# Patient Record
Sex: Male | Born: 1945
Health system: Southern US, Community
[De-identification: ages and names within clinical notes are randomized; demographics above are authoritative.]

## PROBLEM LIST (undated history)

## (undated) DIAGNOSIS — M545 Other chronic pain: Secondary | ICD-10-CM

## (undated) DIAGNOSIS — Z9889 Other specified postprocedural states: Secondary | ICD-10-CM

## (undated) DIAGNOSIS — R7303 Prediabetes: Secondary | ICD-10-CM

## (undated) DIAGNOSIS — R351 Nocturia: Secondary | ICD-10-CM

## (undated) DIAGNOSIS — G8929 Other chronic pain: Secondary | ICD-10-CM

## (undated) DIAGNOSIS — R350 Frequency of micturition: Secondary | ICD-10-CM

## (undated) DIAGNOSIS — Z8719 Personal history of other diseases of the digestive system: Secondary | ICD-10-CM

## (undated) DIAGNOSIS — R3912 Poor urinary stream: Secondary | ICD-10-CM

## (undated) DIAGNOSIS — M1990 Unspecified osteoarthritis, unspecified site: Secondary | ICD-10-CM

## (undated) DIAGNOSIS — R972 Elevated prostate specific antigen [PSA]: Secondary | ICD-10-CM

## (undated) HISTORY — PX: ESOPHAGEAL DILATION: SHX303

## (undated) HISTORY — PX: STOMACH SURGERY: SHX791

## (undated) HISTORY — PX: OTHER SURGICAL HISTORY: SHX169

---

## 1997-08-09 HISTORY — PX: OTHER SURGICAL HISTORY: SHX169

## 1998-03-23 ENCOUNTER — Emergency Department (HOSPITAL_COMMUNITY): Admission: EM | Admit: 1998-03-23 | Discharge: 1998-03-23 | Payer: Self-pay | Admitting: Emergency Medicine

## 2000-04-08 ENCOUNTER — Encounter: Payer: Self-pay | Admitting: Emergency Medicine

## 2000-04-08 ENCOUNTER — Emergency Department (HOSPITAL_COMMUNITY): Admission: EM | Admit: 2000-04-08 | Discharge: 2000-04-08 | Payer: Self-pay | Admitting: Emergency Medicine

## 2000-04-12 ENCOUNTER — Encounter: Admission: RE | Admit: 2000-04-12 | Discharge: 2000-04-21 | Payer: Self-pay | Admitting: Orthopedic Surgery

## 2000-06-23 ENCOUNTER — Encounter: Payer: Self-pay | Admitting: Emergency Medicine

## 2000-06-23 ENCOUNTER — Encounter (INDEPENDENT_AMBULATORY_CARE_PROVIDER_SITE_OTHER): Payer: Self-pay | Admitting: Specialist

## 2000-06-23 ENCOUNTER — Inpatient Hospital Stay (HOSPITAL_COMMUNITY): Admission: EM | Admit: 2000-06-23 | Discharge: 2000-07-05 | Payer: Self-pay | Admitting: Emergency Medicine

## 2000-06-24 ENCOUNTER — Encounter: Payer: Self-pay | Admitting: Surgery

## 2000-06-25 ENCOUNTER — Encounter: Payer: Self-pay | Admitting: Surgery

## 2000-06-26 ENCOUNTER — Encounter: Payer: Self-pay | Admitting: Thoracic Surgery

## 2000-06-27 ENCOUNTER — Encounter: Payer: Self-pay | Admitting: Thoracic Surgery

## 2000-06-28 ENCOUNTER — Encounter: Payer: Self-pay | Admitting: Thoracic Surgery

## 2000-06-29 ENCOUNTER — Encounter: Payer: Self-pay | Admitting: Thoracic Surgery

## 2000-07-01 ENCOUNTER — Encounter: Payer: Self-pay | Admitting: Surgery

## 2000-07-02 ENCOUNTER — Encounter: Payer: Self-pay | Admitting: Surgery

## 2000-07-03 ENCOUNTER — Encounter: Payer: Self-pay | Admitting: Thoracic Surgery

## 2000-07-04 ENCOUNTER — Encounter: Payer: Self-pay | Admitting: Thoracic Surgery

## 2000-07-04 ENCOUNTER — Encounter: Payer: Self-pay | Admitting: Surgery

## 2000-07-05 ENCOUNTER — Encounter: Payer: Self-pay | Admitting: Surgery

## 2000-07-12 ENCOUNTER — Encounter: Admission: RE | Admit: 2000-07-12 | Discharge: 2000-07-12 | Payer: Self-pay | Admitting: Surgery

## 2000-07-12 ENCOUNTER — Encounter: Payer: Self-pay | Admitting: Surgery

## 2000-07-27 ENCOUNTER — Ambulatory Visit (HOSPITAL_COMMUNITY): Admission: RE | Admit: 2000-07-27 | Discharge: 2000-07-27 | Payer: Self-pay | Admitting: Surgery

## 2000-07-27 ENCOUNTER — Encounter: Payer: Self-pay | Admitting: Surgery

## 2000-07-28 ENCOUNTER — Ambulatory Visit (HOSPITAL_COMMUNITY): Admission: RE | Admit: 2000-07-28 | Discharge: 2000-07-28 | Payer: Self-pay | Admitting: Surgery

## 2000-08-10 ENCOUNTER — Ambulatory Visit (HOSPITAL_COMMUNITY): Admission: RE | Admit: 2000-08-10 | Discharge: 2000-08-10 | Payer: Self-pay | Admitting: Surgery

## 2000-08-30 ENCOUNTER — Encounter: Payer: Self-pay | Admitting: Surgery

## 2000-08-30 ENCOUNTER — Inpatient Hospital Stay (HOSPITAL_COMMUNITY): Admission: RE | Admit: 2000-08-30 | Discharge: 2000-09-01 | Payer: Self-pay | Admitting: Surgery

## 2000-08-31 ENCOUNTER — Encounter: Payer: Self-pay | Admitting: Surgery

## 2000-09-08 ENCOUNTER — Ambulatory Visit (HOSPITAL_COMMUNITY): Admission: RE | Admit: 2000-09-08 | Discharge: 2000-09-08 | Payer: Self-pay | Admitting: Surgery

## 2000-09-15 ENCOUNTER — Ambulatory Visit (HOSPITAL_COMMUNITY): Admission: RE | Admit: 2000-09-15 | Discharge: 2000-09-15 | Payer: Self-pay | Admitting: Surgery

## 2000-09-30 ENCOUNTER — Ambulatory Visit (HOSPITAL_COMMUNITY): Admission: RE | Admit: 2000-09-30 | Discharge: 2000-09-30 | Payer: Self-pay | Admitting: Surgery

## 2000-11-02 ENCOUNTER — Ambulatory Visit (HOSPITAL_COMMUNITY): Admission: RE | Admit: 2000-11-02 | Discharge: 2000-11-02 | Payer: Self-pay | Admitting: Surgery

## 2001-01-05 ENCOUNTER — Ambulatory Visit (HOSPITAL_COMMUNITY): Admission: RE | Admit: 2001-01-05 | Discharge: 2001-01-05 | Payer: Self-pay | Admitting: Surgery

## 2001-06-22 ENCOUNTER — Ambulatory Visit (HOSPITAL_COMMUNITY): Admission: RE | Admit: 2001-06-22 | Discharge: 2001-06-22 | Payer: Self-pay | Admitting: Surgery

## 2001-06-23 ENCOUNTER — Encounter: Payer: Self-pay | Admitting: Surgery

## 2001-06-23 ENCOUNTER — Ambulatory Visit (HOSPITAL_COMMUNITY): Admission: RE | Admit: 2001-06-23 | Discharge: 2001-06-23 | Payer: Self-pay | Admitting: Surgery

## 2001-07-27 ENCOUNTER — Ambulatory Visit (HOSPITAL_COMMUNITY): Admission: RE | Admit: 2001-07-27 | Discharge: 2001-07-27 | Payer: Self-pay | Admitting: Surgery

## 2001-08-28 ENCOUNTER — Ambulatory Visit (HOSPITAL_COMMUNITY): Admission: RE | Admit: 2001-08-28 | Discharge: 2001-08-28 | Payer: Self-pay | Admitting: Surgery

## 2001-10-17 ENCOUNTER — Ambulatory Visit (HOSPITAL_COMMUNITY): Admission: RE | Admit: 2001-10-17 | Discharge: 2001-10-17 | Payer: Self-pay | Admitting: Surgery

## 2002-01-10 ENCOUNTER — Ambulatory Visit (HOSPITAL_COMMUNITY): Admission: RE | Admit: 2002-01-10 | Discharge: 2002-01-10 | Payer: Self-pay | Admitting: Surgery

## 2002-04-12 ENCOUNTER — Ambulatory Visit (HOSPITAL_COMMUNITY): Admission: RE | Admit: 2002-04-12 | Discharge: 2002-04-12 | Payer: Self-pay | Admitting: Surgery

## 2004-03-16 ENCOUNTER — Emergency Department (HOSPITAL_COMMUNITY): Admission: EM | Admit: 2004-03-16 | Discharge: 2004-03-16 | Payer: Self-pay | Admitting: Emergency Medicine

## 2009-11-14 ENCOUNTER — Observation Stay (HOSPITAL_COMMUNITY): Admission: EM | Admit: 2009-11-14 | Discharge: 2009-11-15 | Payer: Self-pay | Admitting: Emergency Medicine

## 2009-11-15 ENCOUNTER — Encounter (INDEPENDENT_AMBULATORY_CARE_PROVIDER_SITE_OTHER): Payer: Self-pay | Admitting: Internal Medicine

## 2010-10-28 LAB — URINALYSIS, MICROSCOPIC ONLY
Leukocytes, UA: NEGATIVE
Nitrite: NEGATIVE
Specific Gravity, Urine: 1.046 — ABNORMAL HIGH (ref 1.005–1.030)
Urobilinogen, UA: 1 mg/dL (ref 0.0–1.0)
pH: 5.5 (ref 5.0–8.0)

## 2010-10-28 LAB — CARDIAC PANEL(CRET KIN+CKTOT+MB+TROPI)
CK, MB: 1.9 ng/mL (ref 0.3–4.0)
Relative Index: 0.8 (ref 0.0–2.5)

## 2010-10-28 LAB — BASIC METABOLIC PANEL
CO2: 22 mEq/L (ref 19–32)
Calcium: 8.9 mg/dL (ref 8.4–10.5)
Creatinine, Ser: 1.24 mg/dL (ref 0.4–1.5)
GFR calc Af Amer: >60 mL/min (ref >60)
GFR calc non Af Amer: 59 mL/min — ABNORMAL LOW (ref >60)
Sodium: 137 mEq/L (ref 135–145)

## 2010-10-28 LAB — CULTURE, BLOOD (ROUTINE X 2)
Culture: NO GROWTH
Culture: NO GROWTH

## 2010-10-28 LAB — DIFFERENTIAL
Basophils Relative: 1 % (ref 0–1)
Lymphocytes Relative: 14 % (ref 12–46)
Lymphs Abs: 0.8 10*3/uL (ref 0.7–4.0)
Monocytes Absolute: 0.4 10*3/uL (ref 0.1–1.0)
Monocytes Relative: 7 % (ref 3–12)
Neutro Abs: 4.3 10*3/uL (ref 1.7–7.7)
Neutrophils Relative %: 79 % — ABNORMAL HIGH (ref 43–77)

## 2010-10-28 LAB — POCT I-STAT, CHEM 8
Chloride: 109 mEq/L (ref 96–112)
Glucose, Bld: 128 mg/dL — ABNORMAL HIGH (ref 70–99)
HCT: 49 % (ref 39.0–52.0)
Hemoglobin: 16.7 g/dL (ref 13.0–17.0)
Potassium: 4.2 mEq/L (ref 3.5–5.1)
Sodium: 138 mEq/L (ref 135–145)

## 2010-10-28 LAB — POCT CARDIAC MARKERS
CKMB, poc: <1 ng/mL — ABNORMAL LOW (ref 1.0–8.0)
Troponin i, poc: <0.05 ng/mL (ref 0.00–0.09)

## 2010-10-28 LAB — LIPID PANEL
Cholesterol: 160 mg/dL (ref 0–200)
LDL Cholesterol: 63 mg/dL (ref 0–99)
Total CHOL/HDL Ratio: 1.9 RATIO
Triglycerides: 64 mg/dL (ref <150)

## 2010-10-28 LAB — URINE CULTURE
Colony Count: NO GROWTH
Culture: NO GROWTH
Special Requests: NEGATIVE

## 2010-10-28 LAB — CBC
Hemoglobin: 14.8 g/dL (ref 13.0–17.0)
MCHC: 33.2 g/dL (ref 30.0–36.0)
RBC: 4.96 MIL/uL (ref 4.22–5.81)
WBC: 5.5 10*3/uL (ref 4.0–10.5)

## 2010-12-25 NOTE — Discharge Summary (Signed)
[place]. [facility]  Patient:    Patrick Gonzalez, Patrick Gonzalez                   MRN: [id number] Adm. Date:  [id number] Attending:  Cleatrice Burke Dictator:   Lissa Merlin, P.A.-C                           Discharge Summary  DATE OF BIRTH:  May 31, 1946  ADDENDUM  DISCHARGE MEDICATIONS:  Additional medication is Pepcid 40 mg tablet 1 p.o. q.d. DD:  07/04/00 TD:  07/04/00 Job: [id number]

## 2010-12-25 NOTE — Procedures (Signed)
[place]. [facility]  Patient:    Patrick Gonzalez, Patrick Gonzalez Visit Number: [id number] MRN: [id number]          Service Type: END Location: ENDO Attending Physician:  Cleatrice Burke Dictated by:   Alleen Borne, M.D. Proc. Date: 07/27/01 Admit Date:  07/27/2001                             Procedure Report  PREOPERATIVE DIAGNOSIS:  Esophagogastric anastomotic stricture.  POSTOPERATIVE DIAGNOSIS:  Esophagogastric anastomotic stricture.  PROCEDURE:  Esophageal dilatation using Maloney dilators.  CLINICAL HISTORY:  The patient is a 65 year old gentleman who is status post esophagectomy with an esophagogastric anastomosis in the left neck following iatrogenic perforation of the esophagus during an esophageal dilatation for chronic structure from lye ingestion as a child.  Postoperatively he had had persistent problems with anastomotic stricture.  I last dilated him about three weeks ago in the operating room using Savary dilators over a wire, and he was dilated up to a 18 [place] without difficulty.  I planned to return him to endoscopy today for esophageal dilatation to prevent recurrent stricture formation.  Over the past three weeks he has had no difficulty or change in swallowing.  I discussed the procedure with him, including alternatives, benefits, and risks, including injury to the esophagus or stomach and recurrent stricture.  He understood and agreed to proceed.  DESCRIPTION OF PROCEDURE:  The patient was brought to the endoscopy suite and after informed consent was obtained, esophageal dilatation was performed using [facility] dilators.  I started with a 28 French dilator and then increased sequentially up to a 60 [place] dilator.  These dilators passed easily with minimal resistance at 60 [place].  There was a small amount of blood-tinged secretions on the dilator at 56, 58, and 60 [place].  He had no chest pain.  He tolerated the procedure well and was  given discharge instructions.  He will return in four weeks for repeat dilatation. Dictated by:   Alleen Borne, M.D. Attending Physician:  Cleatrice Burke DD:  07/27/01 TD:  07/28/01 Job: [id number]

## 2010-12-25 NOTE — Discharge Summary (Signed)
[place]. [facility]  Patient:    Patrick Gonzalez, Patrick Gonzalez                   MRN: [id number] Adm. Date:  [id number] Disc. Date: 09/01/00 Attending:  Cleatrice Burke Dictator:   Sherrie George, P.A. CC:         Roosvelt Harps, M.D.                           Discharge Summary  ADMISSION DIAGNOSES: 1. Esophageal strictures. 2. Status post esophagectomy June 13, 2000, secondary to iatrogenic    perforation after a meat impaction. 3. 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  DISCHARGE DIAGNOSES: 1. Esophageal strictures. 2. Status post esophagectomy June 13, 2000, secondary to iatrogenic    perforation after a meat impaction. 3. 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  PROCEDURES: 1. Attempted esophageal dilatation, outpatient and endoscopy suite,    August 30, 2000. 2. Barium swallow, radiology, August 30, 2000. 3. Rigid esophagoscopy with dilatation of the esophageal strictures using    Savoy dilators over a guidewire.  Dilatation from 53 French up to    a 60 French, August 31, 2000, by Alleen Borne, M.D., and D. Karle Plumber, M.D.  BRIEF HISTORY:  The patient is a 65 year old black male status post esophagectomy June 23, 2000, secondary to an iatrogenic perforation during endoscopy for meat impaction and previous lye strictures; returned for scheduled dilatation by Dr. Laneta Simmers.  Dr. Laneta Simmers was unable to pass a #20 Savoy.  The patient was admitted and underwent barium swallow and further treatment as indicated.  PAST MEDICAL HISTORY:  Right thoracotomy, total thoracic esophagoscopy, transmediastinal esophagogastrostomy in the left neck, pyloroplasty, median jejunoscopy June 23, 2000, by Dr. Laneta Simmers.  He had an esophageal perforation June 23, 2000; lye esophageal strictures during this childhood. He has had previous esophageal dilatation and history of  tracheal bronchitis and pneumonia in November 2001.  For further history and physical, please see the dictated note.  HOSPITAL COURSE: The patient was admitted and dilatation was attempted in outpatient endoscopy suite. Dr. Laneta Simmers could only get to a 66 [place] so he discontinued.  He scheduled the patient for x-ray for barium swallow.  This was obtained and showed that there is a tight concentric stricture in the neck. There were no signs of extravasation and subsequently Dr. Laneta Simmers planned to attempt dilatation again in the OR under general anesthesia using [facility] dilators. The risks and benefits were discussed with the patient and informed consent was obtained.  The patient was returned to the OR on August 31, 2000, and underwent rigid esophagoscopy and dilatation of the esophageal strictures using Savoy dilators over a guidewire.  The patient was ultimately dilated from a 15 [place] up to a 67 [place] without difficulty. He is doing well postoperatively and it was Dr. Garen Grams opinion he could go home in the a.m. He will resume his diet as tolerated. Whatever he feels he could swallow will be acceptable.  Dr. Laneta Simmers will have him return in one week for repeat outpatient endoscopic esophageal dilatation and further work-up will be arranged as needed after that.  DISCHARGE ACTIVITY:  Light to moderate, no lifting over 10 pounds, no driving, no strenuous activity.  FOLLOW-UP:  He could obtain follow-up with Roosvelt Harps, M.D., as needed.  CONDITION ON DISCHARGE:  Improving.  LABS:  Electrolytes were normal on admission with  potassium of 3.5, BUN 20, creatinine 1.1. The remainder of the CMET was normal except for bilirubin of 1.5.  White count 6600, hemoglobin 16, hematocrit 47, platelets 29,000. DD:  08/31/00 TD:  09/01/00 Job: [id number]

## 2010-12-25 NOTE — Op Note (Signed)
   NAME:  Patrick Gonzalez, Patrick Gonzalez                      ACCOUNT NO.:  [id number]   MEDICAL RECORD NO.:  [id number]                   PATIENT TYPE:  AMB   LOCATION:  ENDO                                 FACILITY:  [facility]   PHYSICIAN:  Alleen Borne, M.D.               DATE OF BIRTH:  15-Jun-1946   DATE OF PROCEDURE:  04/12/2002  DATE OF DISCHARGE:                                 OPERATIVE REPORT   PREOPERATIVE DIAGNOSIS:  Esophagogastric anastomotic stricture.   POSTOPERATIVE DIAGNOSIS:  Esophagogastric anastomotic stricture.   OPERATIVE PROCEDURE:  Dilatation of esophagogastric anastomotic stricture  using Maloney dilators.   ATTENDING SURGEON:  Alleen Borne, M.D.   ANESTHESIA:  None.   HISTORY OF PRESENT ILLNESS:  This patient is a 65 year old gentleman who is  status post esophagectomy with esophagogastric anastomosis in the left neck  after iatrogenic esophageal perforation during dilatation of a chronic lye  stricture.  I have been following him for the past 1-1/2 years for an  anastomotic stricture performing periodic dilatation when he developed  dysphagia.  He was last dilated on January 10, 2002, at which time he could be  dilated up to a 60-French [facility] dilator.  He has been doing well over the  past couple of months and only had one episode this week where he felt that  he may have been developing a little bit of narrowing in the anastomosis.  After this he continued to eat solid food and has had no further symptoms  but thought that he should have the stricture dilated.  I discussed the  procedure with him including alternatives, benefits, main risks including  bleeding, injury to the esophagus, and death.  He understood and agreed to  proceed.   PROCEDURE:  The patient was taken to the endoscopy lab and positioned  sitting up in a chair.  No anesthesia was given.  The stricture was first  dilated with a 30-French Maloney dilator without any difficulty.  We  sequentially  increased the size of the dilator up to a maximum size of 60-  Jama[place] and there was no resistance to passing the dilator at any time.  He  tolerated the procedure well.  He was given instructions and discharged to  home.  He will call our office for followup.                                               Alleen Borne, M.D.    BKB/MEDQ  D:  04/12/2002  T:  04/13/2002  Job:  [id number]   cc:   Alleen Borne, M.D.  [address]  [place]  [place] [address]  Fax: [phone]

## 2010-12-25 NOTE — Op Note (Signed)
[place]. [facility]  Patient:    Patrick Gonzalez, Patrick Gonzalez Visit Number: [id number] MRN: [id number]          Service Type: DSU Location: [facility] 2899 19 Attending Physician:  Cleatrice Burke Dictated by:   Alleen Borne, M.D. Proc. Date: 06/23/01 Admit Date:  06/23/2001 Discharge Date: 06/23/2001                             Operative Report  PREOPERATIVE DIAGNOSIS:  Esophagogastric anastomotic stricture.  POSTOPERATIVE DIAGNOSIS:  Esophagogastric anastomotic stricture.  OPERATION PERFORMED:  Dilatation of esophagogastric anastomotic stricture using Savary dilators over a guide wire.  SURGEON:  Alleen Borne, M.D.  ANESTHESIA:  General endotracheal.  INDICATIONS FOR PROCEDURE:  The patient is a 65 year old gentleman who is approximately a year status post total esophagectomy for an iatrogenic esophageal perforation while he was having an esophageal stricture dilated. He has had recurrent anastomotic stricture since then.  This has been treated with periodic dilatations using Maloney dilators in the endoscopy suite.  I had dilated him in June of 2002 and asked him to contact me if he developed any change in swallowing.  Unfortunately over the following months after that, he did develop some mild difficult swallowing but did not contact me.  He said that he had had problems with seizure disorder as well as a fall and was unable to contact my office.  He then had returned earlier this week with complaints of being unable to swallow anything but water.  Except for the past two weeks it has been this bad.  I tried to dilate him with [facility] dilators in the endoscopy suite yesterday, but I was only able to pass a 20 [place] dilator.  A 22 [place] would not pass and there was a lot of resistance. I therefore thought it would be best to perform dilatation over Savary dilators in the operating room.  I discussed the operative procedure with him  including alternatives, benefits, and risks including possibility of perforation and injury to the esophagus or stomach, and recurrent stricture.  He understood and agreed to proceed.  DESCRIPTION OF PROCEDURE:  The patient was taken to the operating room and placed on the table in supine position.  After induction of general endotracheal anesthesia, a rigid esophagoscope was inserted to a point just above the area of the anastomosis.  Then a flexible tip guide wire was passed under fluoroscopic guidance across the anastomosis and down into the stomach. down over this guide wire the stricture, was sequentially dilated beginning with a 20 French Savary dilator and sequentially increasing to a 60 French Savary dilator.  All the dilators passed without significant resistance. There was no blood present on any of the dilators.  This was all performed under fluoroscopic guidance.  Then the patient was awakened, extubated and transported to the patient post anesthesia care unit in satisfactory and stable condition. Dictated by:   Alleen Borne, M.D. Attending Physician:  Cleatrice Burke DD:  06/23/01 TD:  06/23/01 Job: [id number]

## 2010-12-25 NOTE — Discharge Summary (Signed)
[place]. [facility]  Patient:    Patrick Gonzalez, NEE                   MRN: [id number] Adm. Date:  [id number] Disc. Date: 07/05/00 Attending:  Cleatrice Burke Dictator:   Lissa Merlin, P.A. CC:         [facility] office  Roosvelt Harps, M.D.   Discharge Summary  DATE OF BIRTH:  [birth year]  GASTROENTEROLOGY:  Dr. Luther Parody.  ADMISSION DIAGNOSES: 1. Meat impaction, esophagus. 2. Dysphagia.  PAST MEDICAL HISTORY: 1. Lye esophageal strictures since age [age] or [age]. 2. History of esophageal dilatation x 2 in the past.  DISCHARGE DIAGNOSES: 1. Meat impaction, esophagus, removed. 2. Esophageal perforation June 23, 2000. 3. Esophagogastroduodenoscopy with dilatation and iatrogenic perforation    June 23, 2000. 4. Status post right thoracotomy, total thoracic esophagectomy,    transmediastinal esophagogastrostomy in left neck, pyloroplasty, and    feeding jejunostomy placed June 23, 2000. 5. Gastrografin swallow June 28, 2000, showed no leak. 6. Gastrografin swallow followed by thin barium swallow on July 01, 2000,    showed no leak. 7. Pseudomonas tracheobronchitis and pneumonia, started on Tequin July 02, 2000.  HISTORY OF PRESENT ILLNESS:  [name] is a 65 year old black male with history of esophageal strictures since swallowing lye at age [age] or [age].  He has had esophageal dilatation x 2 in the past.  This admission, he reported to the emergency room complaining of dysphagia and possible meat impaction.  He underwent an endoscopy by Dr. Luther Parody.  He had esophageal stricture which was dilated with serial balloons.  The endoscopy could not be passed through the stricture until it was dilated to 12 mm.  The scope was then passed through the stricture, and some impacted meat was reportedly removed.  When the scope was reinserted, the lumen could not be located, and the scope was felt to be in the mediastinum.   Chest x-ray and abdominal x-ray showed extensive mediastinal air which had dissected up into the neck and down into the retroperitoneum.  He underwent an immediate Gastrografin esophagram which showed a perforation in the mid-thoracic esophagus with extensive leakage into the mediastinum.  It was felt that emergent surgery was needed for this life-threatening problem.  Dr. Laneta Simmers was consulted.  Dr. Edwyna Shell was also consulted after reviewing the data and examining Mr. Chermak.  The patient was taken to the operating room for the procedure outlined above.  He tolerated the procedure well and was taken to SICU in stable condition. Postoperatively, he was stable and sedated and was weaned from the ventilator. He was noted to have rhonchi and was put on Tequin for pneumonia.  He continued to make steady progress.  Tube feedings were started, which he tolerated well.  Follow-up Gastrografin swallow and barium swallow showed no leak.  He was transferred to 3300 on June 29, 2000.  He was then transferred to Unit 2000, where he continued to make good progress.  On July 04, 2000, postoperative day #10, he was afebrile, vital signs stable. Tube feedings were going at 50.  He was eating soft foods without difficulty. He has no dysphagia.  Chest tube is discontinued.  He is awake and alert with no new complaints.  Respiratory function is stable at 97% on room air saturation with few rhonchi in the lungs.  Wounds are healing well.  It is anticipated he will be stable for discharge pending satisfactory morning rounds  on July 05, 2000.  MEDICATIONS: 1. Tequin 400 mg p.o. q.d. x 6 more days to complete a 10-day course. 2. Albuterol MDI 2 puffs q.i.d. 3. Atrovent MDI 2 puffs q.i.d. 4. Tylox 1-2 p.o. q.4-6h. p.r.n. for pain.  ALLERGIES:  No known drug allergies.  SPECIAL INSTRUCTIONS:  Mr. Sheeler is told to maintain a soft diet as he has been here in the hospital.  He is to do no  strenuous activity, no heavy lifting.  He is to walk daily.  He is to use his incentive spirometer daily. He is told he can shower.  He is to use mild soap and water only on his wounds and to watch his wounds for signs of infection such as increasing redness, swelling, drainage, or fever, and to call the office if he notices anything unusual with his wounds.  He is told that a home health nurse will see him twice a week to instruct him and his family regarding the daily flushing of his J-tube.  He is told to get a chest x-ray one hour before he sees Dr. Laneta Simmers at [facility] and to bring it with him to see Dr. Laneta Simmers.  FOLLOW-UP:  He will see Dr. Evelene Croon Tuesday, July 12, 2000, at 11:45 a.m. DD:  07/04/00 TD:  07/04/00 Job: [id number]

## 2010-12-25 NOTE — Procedures (Signed)
[place]. [facility]  Patient:    Patrick Gonzalez, Patrick Gonzalez                   MRN: [id number] Proc. Date: 06/23/00 Adm. Date:  [id number] Attending:  Cleatrice Burke                           Procedure Report  PROCEDURE:  Visio-upper endoscopy with removal of foreign body and esophageal dilation.  INDICATIONS:  This is a 65 year old male who presented to the emergency room complaining of inability to swallow since Friday, 6 days ago, with the necessity to expectorate saliva and a sensation of a foreign body stuck in his chest.  It should be noted that he has a history of a lye stricture in infancy and was dilated by his report seven years ago with no subsequent followup.  It should also be noted that in the emergency room, his CBC, electrolytes, and prothrombin time are normal, making it very doubtful that he has been totally obstructed for six days, since he has not dehydrated.  PHYSICAL EXAMINATION:  Reveals anicteric eyes, clear chest, normal heart sounds, abdomen which is muscular but soft without mass, tenderness, or organomegaly. There is a small healed epigastric scar, which the patient states was secondary to his lye ingestion surgery, which required dilation of his esophagus.  PREPARATION:  The patient is being kept NPO.  I discussed with him the procedure of upper endoscopy and the indications for possible dilation and the risk of perforation with dilation.  He agrees to proceed.  PROCEDURE: The patient required 120 mg of Demerol and 12 mg of Versed in order to achieve fairly adequate sedation, though he intermittently became combative.  His throat was anesthetized with Hurricaine spray.  The Olympus video upper endoscope was inserted via the mouth and advanced easily to 25 cm.  At this point, there was a very tight stricture, which would not admit the scope and beyond which nothing could be seen.  Into this opening was advanced a 5 cm through the  scope balloon with an 8 mm diameter.  It advanced very easily.  It was dilated to appropriate pressure for 1 minute x2 and then withdrawn.  The scope could still not be advanced, so a similar 10 mm balloon was used to the same effect.  The scope could still not be advanced, so a 12 mm balloon was used.  At this point, the endoscope could be advanced through the stricture, and there was, indeed, a meat foreign body just beyond the opening of the stricture, which was quite small and was removed with the rat-toothed forceps.  The scope was withdrawn with the small piece of meat, and on re-entry beyond the stricture, no lumen could be found, and I was very suspicious that there was an esophageal perforation.  The procedure was terminated, and the patient was taken to recovery.  Abdominal and chest x-rays are ordered, and he will be treated appropriately, based on these findings.  A surgical consultation will be obtained.  IMPRESSION:  Lye stricture of the esophagus with foreign body, which has been removed.  The stricture has been dilated, however, there is probably a perforation present. DD:  06/23/00 TD:  06/23/00 Job: [id number]

## 2010-12-25 NOTE — Consult Note (Signed)
[facility]  Patient:    Patrick Gonzalez, Patrick Gonzalez                   MRN: [id number] Proc. Date: 04/08/00 Adm. Date:  [id number] Disc. 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  He also had a small wound to the ring finger volarly.  He is an otherwise fairly healthy 65 year old right-hand dominant male with no known drug allergies.  REVIEW OF SYSTEMS:  Noncontributory.  SOCIAL HISTORY:  He smokes, occasional alcohol use.  PAST MEDICAL HISTORY:  Negative.  He is right-handed.  MEDICATIONS:  No current medications.  FAMILY HISTORY:  Noncontributory.  PHYSICAL EXAMINATION:  GENERAL:  A well-developed, well-nourished male, pleasant, alert and oriented x 3.  EXTREMITIES:  Examination of his left hand:  He has obvious open distal phalangeal fractures with nail plate fractures and contamination of the index and long fingers, with a volar laceration on the ring finger.  IMAGING STUDIES:  X-rays showed comminuted fractures of the distal phalanges of the index and long fingers.  No osseous abnormalities of the ring finger.  IMPRESSION:  A 65 year old male with open fractures of the left index and long fingers, small volar laceration on the ring finger.  PROCEDURE NOTE:  The patient was adequately anesthetized with Marcaine and lidocaine plain.  Once adequate anesthesia was obtained, irrigation and debridement and treatment of these open fractures was undertaken with advancement flaps to cover the index and long fingers, with some loss of the distal phalanges and nail bed ablation to the index finger.  The ring finger was closed loosely  with 5-0 Monocryl as it was a simple lower laceration.  DISPOSITION:  The patient was discharged from the emergency department with Keflex 500 mg one p.o. q.i.d. for antibiotic prophylaxis, and Vicodin #20 for pain, and is to follow up in my office in the next 48-72 hours. DD:  05/19/00 TD:  05/19/00 Job: [id number]

## 2010-12-25 NOTE — Op Note (Signed)
[place]. [facility]  Patient:    ABED, SCHAR                   MRN: [id number] Proc. Date: 01/05/01 Adm. Date:  [id number] Attending:  Cleatrice Burke                           Operative Report  PREOPERATIVE DIAGNOSIS:  Esophagogastric anastomotic stricture.  POSTOPERATIVE DIAGNOSIS:  Esophagogastric anastomotic stricture.  OPERATION PERFORMED:  Dilatation of esophagogastric anastomotic stricture using Maloney dilators.  SURGEON:  Alleen Borne, M.D.  ANESTHESIA:  Viscous lidocaine solution.  INDICATIONS FOR PROCEDURE:  The patient is a 65 year old gentleman who is status post esophagectomy with esophagogastric anastomosis in the left neck after iatrogenic perforation of his esophagus during esophageal dilatation. Postoperatively, the patient had had recurrent problems with anastomotic stricture with recurrent dysphagia.  I last dilated him about six weeks ago and for the past three weeks, he has noted progressive dysphagia and is now only able to eat soup and liquids.  I discussed the procedure of esophageal dilatation with him including alternatives, benefits and risks including bleeding, injury to the esophagus requiring further surgery and death.  He understands and agrees to proceed.  DESCRIPTION OF PROCEDURE:  The procedure was performed in the endoscopy suite. Viscous lidocaine solution was used for lubrication of the [facility] dilators. The patient was sequentially dilated with [facility] dilators beginning with 65 [place] and progressively increasing the size.  He was dilated with a 28, 30, 32, 36, 40, 44, 48, 52, 54, 56, 58 and 60 dilators without any difficulty. There was very slight heme with 52 and 54 dilator but none after that.  The patient tolerated the procedure well and was given instructions to call my office in about three weeks for follow-up appointment.  He was instructed not to eat or drink anything for the next several  hours. He will contact my office immediately if he develops any fever, chills, or pain in the neck or chest. DD:  01/05/01 TD:  01/05/01 Job: [id number]

## 2010-12-25 NOTE — Op Note (Signed)
[place]. [facility]  Patient:    Patrick Gonzalez, Patrick Gonzalez Visit Number: [id number] MRN: [id number]          Service Type: DSU Location: [facility] 2854 01 Attending Physician:  Cleatrice Burke Dictated by:   Alleen Borne, M.D. Proc. Date: 01/10/02 Admit Date:  01/10/2002 Discharge Date: 01/10/2002                             Operative Report  PREOPERATIVE DIAGNOSIS:  Esophagogastric anastomotic stricture.  POSTOPERATIVE DIAGNOSIS:  Esophagogastric anastomotic stricture.  PROCEDURE:  Esophageal dilatation with [facility] dilators.  SURGEON: Alleen Borne, M.D.  ANESTHESIA:  Local with lidocaine jelly.  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 Postoperatively he developed an anastomotic stricture, which has been dilated periodically by me.  The patient is now about seven to eight weeks following his last dilatation and has noticed a very slight dysphagia but is still eating solid foods.  I felt that we should proceed with another dilatation at this point.  I discussed the procedure with the patient including alternatives, benefits, and risks of injury to the esophagus or stomach, possible anastomotic disruption or perforation, and recurrence of the stricture.  He understands and agrees to proceed.  DESCRIPTION OF PROCEDURE:  The patient was brought to the endoscopy unit and positioned upright in a chair.  Maloney dilators were used and were lubricated with a mixture of lidocaine jelly and Surgilube.  I began dilatation with a 30 French dilator, which passed without any resistance.  I sequentially dilated the patient with larger dilators.  I did not feel any resistance until I used a 44 [place] dilator.  This resistance was very slight.  The patient was sequentially dilated up to a 60 [place]  Maloney dilator.  The 83 [place] and 60 [place] dilators had a snug fit but passed without undue pressure.  There was no blood on any of the dilators.  The patient tolerated the procedure well and was discharged home.  He was instructed to contact my office as soon as he develops any dysphagia symptoms so that another dilatation can be scheduled. He will begin liquids later today and will contact my office if he develops any fever or chills, pain in the neck or chest, or dysphagia. Dictated by:   Alleen Borne, M.D. Attending Physician:  Cleatrice Burke DD:  01/10/02 TD:  01/12/02 Job: [id number]

## 2010-12-25 NOTE — Op Note (Signed)
[place]. [facility]  Patient:    Patrick Gonzalez, Patrick Gonzalez                   MRN: [id number] Proc. Date: 09/15/00 Adm. Date:  [id number] Attending:  Cleatrice Burke                           Operative Report  PREOPERATIVE DIAGNOSIS:  Esophagogastric anastomotic stricture, status post esophagectomy.  POSTOPERATIVE DIAGNOSIS:  Esophagogastric anastomotic stricture, status post esophagectomy.  OPERATION PERFORMED:  Dilatation of esophagogastric anastomotic stricture using Maloney dilators.  SURGEON:  Alleen Borne, M.D.  ANESTHESIA:  Local with viscous lidocaine.  INDICATIONS FOR PROCEDURE:   The patient is well known to me and has required repeated esophageal dilatations for anastomotic stricture after an esophagectomy with esophagogastric anastomosis in the left neck in November of 2001.  I last dilated him one week ago at which time I was able to dilate him to a #56 [place] dilator.  In the past week, he has had no symptoms or dysphagia.  DESCRIPTION OF PROCEDURE:  The patient was taken to the endoscopy suite.  No intravenous sedation was needed.  I used viscous lidocaine jelly mixed with Surgilube to lubricate the dilators.  He was sequentially dilated from 76 [place] to 19 [place] without difficulty.  A 56 French dilator was somewhat tight but I was able to insert it.  There was no blood on any of the dilators when they were removed.  The patient tolerated the procedure well.  He was instructed to take clear liquids this morning and then advance to a regular diet as tolerated.  I will plan to see him back in about three weeks for repeated dilatation or sooner if he develops any further symptoms of dysphagia. DD:  09/15/00 TD:  09/15/00 Job: [id number]

## 2010-12-25 NOTE — Op Note (Signed)
[place]. [facility]  Patient:    Patrick Gonzalez, Patrick Gonzalez                   MRN: [id number] Proc. Date: 08/30/00 Adm. Date:  [id number] Attending:  Cleatrice Burke                           Operative Report  PREOPERATIVE DIAGNOSIS:  Esophageal anastomotic stricture, status post esophagectomy.  POSTOPERATIVE DIAGNOSIS:  Esophageal anastomotic stricture, status post esophagectomy.  OPERATION PERFORMED:  Dilatation of esophageal stricture using Savary dilators.  SURGEON:  Alleen Borne, M.D.  ANESTHESIA:  None.  INDICATIONS FOR PROCEDURE:  The patient is a 65 year old gentleman who is approximately one month status post esophagectomy with a left cervical esophagogastric anastomosis for a perforated esophagus that occurred during dilatation of an esophageal stricture and treatment for an esophageal meat impaction.  The patient had a history of lye exposure to the esophagus and stomach as a child and had such bad esophageal disease that esophagectomy was required for that perforation.  The patient had an uncomplicated postoperative course.  He has developed an esophageal stricture at the site of anastomosis and this has been dilated twice.  His last dilatation was about two and a half weeks ago.  Dr. Edwyna Shell performed this and was able to easily dilate the anastomosis to a 50 French Savary dilator.  I saw the patient in the office about a week ago and he had no problems swallowing.  He said that last Friday he started noticing his swallowing becoming more difficult and over the weekend could only swallow thin liquids.  He has had no pain.  He denies any fever or chills.  DESCRIPTION OF PROCEDURE:  The patient was taken to the endoscopy suite and the procedure was performed without any anesthetic.  I used viscous lidocaine solution mixed with Surgilube to lubricate the dilators.  I first passed a 51 French Savary dilator and this was snug.  I next passed an  18 Savary dilator which passed with some resistance, but was able to completely pass the anastomosis.  I then tried a 20 [place] Savary dilator and this would not pass. Therefore, the procedure was ended.  He tolerated it well.  After discussion with Dr. Edwyna Shell, we decided to admit the patient to the hospital and obtain a barium swallow to re-evaluate the anastomosis before proceeding with further treatment.  The patient was taken to the holding area and was admitted to [facility]. DD:  08/30/00 TD:  08/30/00 Job: [id number]

## 2010-12-25 NOTE — H&P (Signed)
[place]. [facility]  Patient:    Patrick Gonzalez, Patrick Gonzalez                     MRN: [id number] Adm. Date:  08/30/00 Dictator:   Lissa Merlin, P.A.                         History and Physical  NO DICTATION DD:  08/30/00 TD:  08/30/00 Job: [id number]

## 2010-12-25 NOTE — Op Note (Signed)
[place]. [facility]  Patient:    Patrick Gonzalez, Patrick Gonzalez                   MRN: [id number] Proc. Date: 08/31/00 Adm. Date:  [id number] Attending:  Cleatrice Burke                           Operative Report  PREOPERATIVE DIAGNOSIS:  Esophagogastric anastomotic stricture.  POSTOPERATIVE DIAGNOSIS:  Esophagogastric anastomotic stricture.  PROCEDURES:  Rigid esophagoscopy, dilatation of esophageal stricture using Savary dilators over a guidewire.  SURGEON:  Alleen Borne, M.D.  ASSISTANT:  Norton Blizzard, M.D.  ANESTHESIA:  General endotracheal.  CLINICAL HISTORY:  This patient is a 65 year old gentleman who is status post esophagectomy on 06/23/00 with an esophagogastric anastomosis in the left neck.  This was performed due to an iatrogenic esophageal perforation during endoscopy for a meat impaction.  The patient had a history of previous lye strictures as a child with diffuse involvement of the esophagus and some involvement of the stomach.  Postoperatively, he developed an anastomotic stricture and has had two prior dilatations.  He was last dilated a little over two weeks ago by Dr. Edwyna Shell, who was able to easily dilate him up to a 55 [place].  I had him scheduled for esophageal dilatation and endoscopy yesterday.  The patient reported on Friday he had noted some difficulty swallowing, and over the weekend it worsened to the point where he could only take liquids.  I attempted to dilate him in endoscopy yesterday but was only able to pass an 72 [place] dilator.  Therefore, obtained an esophagogram which showed the anastomosis was strictured, with a 5 mm opening.  The esophagus just above and below the anastomosis appeared unremarkable and of normal caliber.  Therefore, Dr. Edwyna Shell and I felt that esophageal dilatation under anesthesia using Savary dilators over a wire was the best treatment.  I discussed the operative procedure with the patient,  including alternatives, benefits, and risks, including esophageal perforation and recurrent stricture requiring more procedures.  He understood and agreed to proceed.  DESCRIPTION OF PROCEDURE:  The patient was taken to the operating room and placed on the table in supine position.  After induction of general endotracheal anesthesia, a small rigid esophagoscope was passed without difficulty to a point just above the anastomosis.  The cervical esophagus appeared unremarkable.  The esophagus just above the anastomosis appeared unremarkable, but there was obvious stricture in this area, and I could not pass the scope through the anastomosis.  The anastomosis was then dilated using sequential leather-tip dilators.  Then a soft-tip guidewire was passed through the anastomosis under fluoroscopy.  Over this guidewire, sequential Savary dilators were passed.  We began at 54 [place] and proceeded sequentially up to a 60 [place] or 20 mm dilator without difficulty.  Each dilator was passed under fluoroscopic guidance.  There was no blood on any of the dilators when they were removed.  The guidewire was then removed from the patient.  He was awakened, extubated, and transported to the postanesthesia care unit in satisfactory and stable condition. DD:  08/31/00 TD:  08/31/00 Job: [id number]

## 2010-12-25 NOTE — H&P (Signed)
[place]. [facility]  Patient:    Patrick Gonzalez, Patrick Gonzalez                     MRN: [id number] Adm. Date:  08/30/00 Attending:  Alleen Borne, M.D. Dictator:   Lissa Merlin, P.A.                         History and Physical  DATE OF BIRTH:  [birth year]  CHIEF COMPLAINT:  Esophageal anastomotic stricture.  HISTORY OF PRESENT ILLNESS:  This is a 65 year old black male status post esophagectomy June 23, 2000, secondary to iatrogenic perforation during endoscopy for a meat impaction with previous lye strictures.  He returns today for a scheduled dilatation by Dr. Laneta Simmers.  Incidentally the patient relates being unable to pass solid foods for the last 4 days.  He underwent dilatation and was unable to have a number 20 savory passed.  He is therefore admitted for barium swallow to assess anastomotic stricture.  PAST MEDICAL HISTORY: 1. Right thoracotomy with total thoracic esophagectomy, transmediastinal    gastrostomy in the left neck, pyloroplasty and feeding jejunostomy on    June 23, 2000. 2. esophageal perforation June 23, 2000. 3. Lye esophageal strictures since childhood. 4. Previous esophageal dilatations. 5. Tracheobronchitis and pneumonia, November 2001.  (He denies a history of hypertension and coronary artery disease, stroke, diabetes, prostate, liver, thyroid cancer, GI/GU problems or seizures).  PAST SURGICAL HISTORY:  As above with "stomach surgery" age 65 after ingesting lye. after ingesting lye.  SOCIAL HISTORY:  He is single and disabled.  He stays fairly active.  He has 1 child, a daughter who is in the service.  He has 2 sisters and a brother who check on him.  FAMILY HISTORY:  No significant family history.  Specifically denies history of coronary artery disease, hypertension, diabetes or cancer.  MEDICATIONS:  He takes no current medications.  ALLERGIES:  No known drug allergies.  REVIEW OF SYSTEMS:  He has a cough when he lays on his left  or right side. Unable to take solids as noted above for the last 4 days.  Denies fever, chills, sweats, shortness of breath.  He denies headache, visual changes, gastrointestinal or genitourinary symptoms.  Specifically denies abdominal pain, nausea and vomiting, or diarrhea.  PHYSICAL EXAMINATION:  VITAL SIGNS:  Temperature 97.5, blood pressure 132/86, pulse 54 regular. Respirations 18.  HEENT:  Normocephalic and atraumatic.  Pupils, equal, round, reactive to light and accommodation and visual acuity intact.  Extraocular movements intact OU. Oropharynx is grossly normal.  He has dentures upper and lower plates.  NECK:  Supple with a healed left surgical scar.  There are 2+ carotids with no bruits.  CHEST:  Symmetrical with clear lungs.  There is a healed right thoracotomy scar.  HEART:  Regular rate and rhythm.  S1, S2.  ABDOMEN:  Soft with an old surgical scar from childhood.  Nontender, nondistended with positive bowel sounds.  EXTREMITIES: Bilateral upper extremities with normal pulses, normal range of motion.  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  NEUROLOGIC:  Alert and oriented x 3.  Cranial nerves II-XII are grossly intact.  There are no focal deficits.  ASSESSMENT AND PLAN:  This is a 65 year old black male status post esophagectomy on June 23, 2000, secondary to perforation and returns for his third postoperative dilatation as scheduled.  He incidentally has been unable  to pass solid foods for 4 days.  During dilatation he was found to have significant stricture.  This was assessed with barium swallow.  After reviewing the study, Dr. Laneta Simmers recommends having dilatation under anesthesia in the OR tomorrow.  He is made n.p.o. and given IV fluids.  He is overall stable. DD:  08/30/00 TD:  08/30/00 Job: [id number]

## 2010-12-25 NOTE — Op Note (Signed)
[place]  Patient:    Patrick Gonzalez, Patrick Gonzalez                   MRN: [id number] Proc. Date: 06/23/00 Adm. Date:  [id number] Attending:  Cleatrice Burke                           Operative Report  PREOPERATIVE DIAGNOSIS:  Esophageal perforation status post EGD for meat impaction with history of esophageal lie stricture.  POSTOPERATIVE DIAGNOSIS:  Esophageal perforation status post EGD for meat impaction with history of esophageal lie stricture.  PROCEDURE:  Right thoracotomy, total thoracic esophagectomy, transmediastinal esophagogastrostomy in the left neck, pyloroplasty, and feed jejunostomy.  SURGEON:  Alleen Borne, M.D.  ASSISTANT:  Norton Blizzard, M.D.  SECOND ASSISTANT:  Loura Pardon, P.A.  ANESTHESIA:  General endotracheal anesthesia.  CLINICAL HISTORY:  This patient is a 65 year old black male with a history of esophageal lie stricture since the age of 55 or 7.  At that time he had apparently had a gastrostomy performed but details of his treatment are otherwise unclear.  The patient presented today with reports of dysphagia and possible meat impaction.  He underwent endoscopy by Dr. Luther Parody.  He had an esophageal stricture at approximately 25 cm.  This was dilated with serial balloons from 8 mm to 12 mm.  The endoscope apparently could not be passed through the stricture until it was dilated to 12 mm.  The scope was then passed through the stricture and some impacted meat was reportedly removed. When the scope was reinserted the lumen could not be located and the scope was felt to be in the mediastinum.  Chest x-ray and abdominal x-ray showed extensive mediastinal air which had dissected up into the neck as well as down into the retroperitoneum outlining the retroperitoneal structures.  He underwent an immediate Gastrografin esophagogram which showed a perforation in the mid thoracic esophagus with extensive leakage into the  mediastinum.  The esophageal contour was markedly abnormal from the level of the top of the aortic arch and down to the area of perforation.  No contrast is seen entering the stomach.  It was felt that emergent surgery was required for this life threatening problem.  The patient was heavily sedated and somewhat agitated and combative at this time and was in obvious distress.  He had no family or friends with him and no one could be contacted.  We felt that it was in his best interest to proceed to the operating room since this was a life threatening illness and time was of critical importance.  After discussion with Dr. Edwyna Shell we felt that the best course of action was thoracic esophagectomy since the patient had a history of lie ingestion with stricture in the esophagus.  Very abnormal on esophagogram from the level of the upper aortic arch downwards.  The patient was taken to the operating room in hemodynamically stable condition.  OPERATIVE PROCEDURE:  The patient was taken to the operating room and placed on the table in the supine position.  After the induction of general endotracheal anesthesia using a double lumen endotracheal tube a Foley catheter was placed in the bladder using sterile technique.  Preoperative intravenous antibiotics were given.  Then the patient was placed in the left lateral decubitus position with the right side up and the hips tilted backward to partially expose the abdomen if needed.  The right side of  the neck was also prepped into the field.  Then the chest was entered through a right posterolateral thoracotomy incision.  The pleural space was entered through the 5th intercostal space.  The 6th rib was divided posteriorly to allow exposure.  Chest retractor was placed.  There were adhesions present between the lung and chest wall and these were divided using electrocautery.  Examination of the posterior mediastinum showed extensive  mediastinal emphysema.  The area of the mid esophagus was identified and there was some material leaking from this area.  There was very little mediastinal contamination surprisingly.  There was no significant fluid in the pleural space.  Then the mid esophagus was mobilized and encircled with a Penrose drain.  We then proceeded to mobilize the thoracic esophagus throughout the right chest.  The esophagus was markedly thickened and there was significant scarring in the periesophageal tissues probably from chronic inflammation from the remote lie stricture.  With perseverance the esophagus was completely mobilized from the diaphragmatic hiatus up to the thoracic inlet.  Since there was very little mediastinal or pleural space contamination we felt it would be best to try to proceed with immediate reconstruction.  We felt that the best course would be to try to do a transmediastinal esophagogastrotomy using the bed of the native esophagus for the stomach.  We felt exposure would be best if we closed the chest and positioned the patient supine to allow exposure of the left neck also.  The pleural space was irrigated with warm saline solution.  There was complete hemostasis.  There was no debris present.  Then two 32 French chest tubes were brought in through separate stab incisions and one positioned posteriorly and one anteriorly.  The right lung was reinflated and there were no leaks.  Then the ribs were reapproximated with #2 Vicryl pericostal sutures.  The muscles were closed in layers with continuous #1 Vicryl suture.  Subcutaneous tissue was closed with continuous 2-0 Vicryl and the skin with staples.  A dry sterile dressing was applied over the incision.  The sponge, needle and instrument counts were correct according to the nurse at this time.  Then the patient was positioned into the supine position with the left neck exposed.  The neck, chest and abdomen were then prepped with  Betadine soap and solution and redraped in the usual sterile manner.  The abdomen was entered through an upper midline incision at the left side of the umbilicus.  Upon entering the abdominal cavity the greater omentum was  somewhat adhesed to the anterior abdominal wall at the area of the previous gastrotomy site but the stomach was not adhered.  Examination of the stomach showed that it was relatively long and appeared to be of good tissue.   Then the Kocher maneuver was performed to mobilize the pylorus and the duodenum. the stomach was further mobilized by dividing the greater omentum from the stomach beyond the right gastropyloric artery and vein.  The pancreas was identified and appeared normal.  Then the short gastric vessels were divided between clamps and tied.  Then the branches to the posterior aspect of the stomach were divided between clamps and tied.  The left gastric artery and vein were then identified.  The right gastric artery was carefully preserved.  The left gastric vessels were divided between clamps and suture ligated.  Then further mobilization of the stomach was performed at the esophageal hiatus until this dissection met up with the chest dissection and the stomach was  completely freed.  The esophageal hiatus was somewhat enlarged to allow the stomach to move into the chest.  Then a pyloroplasty was performed and this was covered with omentum.  Then a feeding jejunostomy tube was inserted using a red rubber catheter.  This was brought out through a separate stab incision in the abdominal wall and the jejunum was packed through the abdominal wall at the site of the jejunostomy tube.  Then an oblique left neck incision was made along the anterior border of the Sternocleidomastoid muscle.  This was carried down through the platysma muscle using electrocautery.  The sternocleidomastoid and carotid and jugular vessels were retracted laterally.  The inferior  thyroid vein was divided and tied. Then dissection was performed to expose the cervical esophagus.  There was extensive emphysema present in this area also.  The left recurrent laryngeal nerve was identified in the tracheoesophageal groove and was mobilized and carefully preserved.  Care was taken not to retract on this nerve.  The cervical esophagus was encircled without difficulty with a Penrose drain. It was further mobilized.  The cervical esophagus appeared relatively normal. INitially we tried to bring the stomach up through the posterior mediastinum into the neck by pulling up on the esophagus.  However, we were unsuccessful in getting the stomach high enough into the neck.  Therefore the cervical esophagus was divided using stapler.  A Penrose drain and camera sleeve were then attached to the distal end of the esophagus and this was pulled down through the posterior mediastinum into the abdomen.  Then the gastroesophageal junction was stapled and divided.  This was reinforced with interrupted silk sutures.  Then the Penrose drain was attached to the fundus of the stomach. Then the stomach was advanced through the camera sleeve and up through the posterior mediastinum and up to the neck.  The camera sleeve was removed without difficulty and the Penrose drain removed.  The stomach appeared viable, and there were no signs of vascular compromise.  The pylorus was at the diaphragmatic hiatus.  then the cervical esophagogastrostomy was performed in two layers using an outer layer of 3-0 silk interrupted sutures and an inner layer of interrupted 3-0 Vicryl sutures.  The anastomosis was lying right at the thoracic inlet.  A NG tube was advanced through the anastomosis into the stomach before completing it.  Then a flat Jackson-Pratt drain was brought through a separate stab incision and positioned adjacent to the anastomosis.  The neck incision was then closed in layers.  Staples used for  the skin.  The stomach was packed to the esophageal diaphragmatic hiatus using interrupted 2-0 silk sutures.  There was complete hemostasis.  Then the abdominal wall was closed using continuous #1 PDS suture and skin staples.   The sponge, needle and instrument counts were correct according to the scrub nurse.  A dry sterile dressing applied over the incision and then around the tubes which were hooked to appropriate suction devices.  Then the double lumen endotracheal tube was switched to a single lumen tube and the patient transported to the surgical intensive care unit in guarded but stable condition. DD:  06/24/00 TD:  06/24/00 Job: [id number]

## 2010-12-25 NOTE — Op Note (Signed)
[place]. [facility]  Patient:    Patrick Gonzalez, Patrick Gonzalez Visit Number: [id number] MRN: [id number]          Service Type: END Location: ENDO Attending Physician:  Cleatrice Burke Dictated by:   Alleen Borne, M.D. Proc. Date: 10/17/01 Admit Date:  10/17/2001 Discharge Date: 10/17/2001                             Operative Report  PREOPERATIVE DIAGNOSIS:  Esophagogastric anastomotic stricture.  POSTOPERATIVE DIAGNOSIS:  Esophagogastric anastomotic stricture.  PROCEDURE:  Dilatation of esophagogastric anastomotic stricture with [facility] dilators.  ATTENDING SURGEON:  Alleen Borne, M.D.  ANESTHESIA:  Topical lidocaine jelly.  CLINICAL HISTORY:  This patient is a 65 year old gentleman, who I had been following over the past year or so with esophagogastric anastomotic stricture following total esophagectomy after iatrogenic perforation during dilatation of a ______ stricture.  He has been requiring dilatation approximately every 4 weeks to maintain patency of this anastomotic stricture.  OPERATIVE PROCEDURE:  The patient was taken to the endoscopy suite and positioned sitting up in a chair.  After obtaining informed consent, the anastomotic stricture was sequentially dilated using Maloney dilators. Topical lidocaine jelly and Surgilube were mixed together to lubricate the dilators.  I started with 26-French dilator, which passed easily.  There was no resistance to dilatation until approximately 50-French.  I was able to dilate him to a 60-French, which was tight, but did pass.  He tolerated the procedure well.  He was given postprocedure instructions and was discharged home.  I will plan to dilate him again in about four weeks. Dictated by:   Alleen Borne, M.D. Attending Physician:  Cleatrice Burke DD:  10/19/01 TD:  10/21/01 Job: [id number]

## 2011-11-19 ENCOUNTER — Emergency Department (INDEPENDENT_AMBULATORY_CARE_PROVIDER_SITE_OTHER)
Admission: EM | Admit: 2011-11-19 | Discharge: 2011-11-19 | Disposition: A | Discharge status: Nursing Home (Includes ICF) | Payer: Medicare Other | Source: Home / Self Care | Attending: Emergency Medicine | Admitting: Emergency Medicine

## 2011-11-19 ENCOUNTER — Emergency Department (HOSPITAL_COMMUNITY)
Admission: EM | Admit: 2011-11-19 | Discharge: 2011-11-19 | Disposition: A | Discharge status: Home / Self Care | Payer: Medicare Other | Attending: Emergency Medicine | Admitting: Emergency Medicine

## 2011-11-19 ENCOUNTER — Emergency Department (HOSPITAL_COMMUNITY): Payer: Medicare Other

## 2011-11-19 ENCOUNTER — Encounter (HOSPITAL_COMMUNITY): Payer: Self-pay | Admitting: Emergency Medicine

## 2011-11-19 ENCOUNTER — Encounter (HOSPITAL_COMMUNITY): Payer: Self-pay | Admitting: *Deleted

## 2011-11-19 DIAGNOSIS — R221 Localized swelling, mass and lump, neck: Secondary | ICD-10-CM

## 2011-11-19 DIAGNOSIS — R220 Localized swelling, mass and lump, head: Secondary | ICD-10-CM

## 2011-11-19 LAB — CBC
HCT: 44.6 % (ref 39.0–52.0)
Hemoglobin: 14.8 g/dL (ref 13.0–17.0)
MCH: 29.4 pg (ref 26.0–34.0)
MCV: 88.7 fL (ref 78.0–100.0)
RBC: 5.03 MIL/uL (ref 4.22–5.81)

## 2011-11-19 LAB — DIFFERENTIAL
Basophils Relative: 0 % (ref 0–1)
Eosinophils Relative: 2 % (ref 0–5)
Lymphs Abs: 2.4 10*3/uL (ref 0.7–4.0)
Monocytes Absolute: 0.3 10*3/uL (ref 0.1–1.0)
Monocytes Relative: 8 % (ref 3–12)

## 2011-11-19 LAB — BASIC METABOLIC PANEL
CO2: 29 mEq/L (ref 19–32)
Calcium: 9.4 mg/dL (ref 8.4–10.5)
Chloride: 102 mEq/L (ref 96–112)
Creatinine, Ser: 1.16 mg/dL (ref 0.50–1.35)
Glucose, Bld: 94 mg/dL (ref 70–99)

## 2011-11-19 MED ORDER — IOHEXOL 300 MG/ML  SOLN
100.0000 mL | Freq: Once | INTRAMUSCULAR | Status: AC | PRN
  Administered 2011-11-19: 100 mL via INTRAVENOUS

## 2011-11-19 MED ORDER — SODIUM CHLORIDE 0.9 % IV SOLN
INTRAVENOUS | Status: DC
  Administered 2011-11-19: 14:00:00 via INTRAVENOUS

## 2011-11-19 NOTE — ED Notes (Addendum)
PT HERE WITH LEFT JAW SWELLING AND PAIN THAT STARTED X 3WEEKS AGO BUT HAS PROGRESSED TO L TEMPORAL.PT DENIES TOOTH ACHE OR NUMB/TINGLING.PAIN WITH YAWNING OR EATING.PT ALSO STATES YRS AGO HE HAD TUMOR IN RIGHT JAW WITH SURGERY.

## 2011-11-19 NOTE — ED Notes (Signed)
Pt reports several week hx of progressively worsening right jaw swelling that has now progressed to right temporal area, pt reports mild discomfort with chewing. Pt does not have teeth.

## 2011-11-19 NOTE — ED Provider Notes (Signed)
History     CSN: [id number]  Arrival date & time 11/19/11  1004   First MD Initiated Contact with Patient 11/19/11 1056      Chief Complaint  Patient presents with  . Facial Swelling    (Consider location/radiation/quality/duration/timing/severity/associated sxs/prior treatment) The history is provided by the patient and medical records.   the patient is a 66 year old, male, who complains of swelling on the left side of his face that causes mild to moderate discomfort.  Discomfort is mostly when he chews food.  He denies headache.  He denies nausea, vomiting.  He denies weight loss or night sweats.  He states that he had a history of a right-sided facial tumor that was excised approximately 10 years ago.  He did not smoke.  History reviewed. No pertinent past medical history.  Past Surgical History  Procedure Date  . Tumor excision     History reviewed. No pertinent family history.  History  Substance Use Topics  . Smoking status: Never Smoker   . Smokeless tobacco: Not on file  . Alcohol Use: No      Review of Systems  Constitutional: Negative for fever, chills, diaphoresis, appetite change and unexpected weight change.  HENT: Positive for facial swelling. Negative for hearing loss, ear pain and neck pain.   Respiratory: Negative for choking.   Gastrointestinal: Negative for nausea and vomiting.  Neurological: Negative for dizziness and headaches.  All other systems reviewed and are negative.    Allergies  Review of patient's allergies indicates no known allergies.  Home Medications   Current Outpatient Rx  Name Route Sig Dispense Refill  . ASPIRIN 325 MG PO TABS Oral Take 650 mg by mouth every 6 (six) hours as needed. pain      BP 143/99  Pulse 52  Temp(Src) 97.6 F (36.4 C) (Oral)  Resp 18  SpO2 100%  Physical Exam  Vitals reviewed. Constitutional: He is oriented to person, place, and time. He appears well-developed and well-nourished.  HENT:    Head: Normocephalic and atraumatic.  Mouth/Throat: Oropharynx is clear and moist. No oropharyngeal exudate.       Swelling on the left side of the face from the left angle of the mandible to the left temporal area.  It is indurated and nontender.  No submandibular lymphadenopathy or supraclavicular lymphadenopathy  Eyes: Conjunctivae are normal.  Neck: Normal range of motion. Neck supple. No tracheal deviation present.  Pulmonary/Chest: No respiratory distress.  Musculoskeletal: Normal range of motion.  Lymphadenopathy:    He has no cervical adenopathy.  Neurological: He is alert and oriented to person, place, and time.  Skin: Skin is warm and dry.  Psychiatric: He has a normal mood and affect. Thought content normal.    ED Course  Procedures (including critical care time) Left facial swelling for 3 weeks with no weight loss or night sweats.  No oral lesions.  The patient does not have a primary care physician, to whom I can send him for followup.  Therefore, we will do a CAT scan of his face.  Looking for signs of malignancy  Labs Reviewed  CBC - Abnormal; Notable for the following:    WBC 3.7 (*)    All other components within normal limits  DIFFERENTIAL - Abnormal; Notable for the following:    Neutrophils Relative 25 (*)    Lymphocytes Relative 65 (*)    Neutro Abs 0.9 (*)    All other components within normal limits  BASIC METABOLIC PANEL -  Abnormal; Notable for the following:    GFR calc non Af Amer 65 (*)    GFR calc Af Amer 76 (*)    All other components within normal limits   Ct Maxillofacial W/cm  11/19/2011  *RADIOLOGY REPORT*  Clinical Data: Left facial swelling.  Increasing discomfort with chewing.  Previous history of benign tumors in the right mandible.  CT MAXILLOFACIAL WITH CONTRAST  Technique:  Multidetector CT imaging of the maxillofacial structures was performed with intravenous contrast. Multiplanar CT image reconstructions were also generated.  Contrast:  OMNIPAQUE IOHEXOL 300 MG/ML  SOLN  Comparison: None.  Findings: A heterogeneous mixed density cystic and solid soft tissue mass is associated with the left mandibular condyle and ramus.  The mass is contained within the masticator space and measures at least 8 cm cephalocaudad.  There is displacement of the muscles of mastication.  The mass fills the infratemporal fossa and slightly bows the posterior aspect of the left maxillary sinus without additional osseous destruction.  There is some calcified matrix within the lesion.  A component extends superiorly within the temporalis musculature.  There is some denser posterior calcification along the mass. The residual left mandibular condyle is subluxed anteriorly.  The calvarium is otherwise intact.  Pterygoid plates are intact despite displacement the pterygoid musculature.  The lesion does not seem to infiltrate the pterygoid muscles as much as it displaces them.  It also displaces the masseter muscle laterally.  Mild circumferential mucosal thickening is present in the right maxillary sinus with a small fluid level.  Leftward nasal spurring is evident.  The mastoid air cells are clear.  No significant cervical adenopathy is present. Limited imaging of the brain is unremarkable.  Mild spondylosis is evident in the upper cervical spine without a focal osseous lesion.  IMPRESSION:  1.  Heterogeneous expansile mass lesion within the left masticator space.  The mass distorts and expands the ramus of the left mandible and left mandibular condyle.  Chondroid matrix is evident. 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 Chondrosarcoma is not excluded.  However, the tumor has not destroyed other bone. Chronic atypical infection such as tuberculosis is considered less likely.  This is too high to be a typical dentigerous lesion and does not appear to be primarily involve with the muscles. 2.  Minimal acute and chronic sinus disease in the right  maxillary sinus.  Original Report Authenticated By: Jamesetta Orleans. MATTERN, M.D.     No diagnosis found.  I spoke with Dr. Jearld Fenton. We discussed presentation and ct result.  He rec'd referral to [facility] for eval and tx.    I contacted [facility].  We refer to Dr. Nonie Hoyer in ENT dept for eval.    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   No resp distress. No toxicity.          Cheri Guppy, MD 11/19/11 1539

## 2011-11-19 NOTE — ED Notes (Signed)
Pt getting undressed and into a gown at this time 

## 2011-11-19 NOTE — ED Provider Notes (Signed)
History     CSN: [id number]  Arrival date & time 11/19/11  4098   First MD Initiated Contact with Patient 11/19/11 [phone]      Chief Complaint  Patient presents with  . Facial Swelling    (Consider location/radiation/quality/duration/timing/severity/associated sxs/prior treatment) HPI Comments: Patient presents urgent care complaining of ongoing left lower jaw swelling that is extending towards his left temporal area that has been given increasing discomfort and problems when he chews. And denies any pain redness or swelling of his left lower or upper gum line and patient is totally edentulous from left lower dentition.  Patient denies any fevers, any numbness tingling of his face. Patient also describes that about 10 years ago he had some surgical interventions as he had some tumors on his right jaw that he describes as a told him that they were benign. He describes that the swelling is much bigger and is given him problems to [facility]  The history is provided by the patient.    History reviewed. No pertinent past medical history.  Past Surgical History  Procedure Date  . Tumor excision     History reviewed. No pertinent family history.  History  Substance Use Topics  . Smoking status: Never Smoker   . Smokeless tobacco: Not on file  . Alcohol Use: No      Review of Systems  Constitutional: Positive for activity change. Negative for fever, diaphoresis and unexpected weight change.  HENT: Negative for ear pain, congestion and neck stiffness.   Eyes: Negative for pain.  Respiratory: Negative for cough.   Cardiovascular: Positive for leg swelling.  Skin: Negative for color change and wound.  Neurological: Negative for dizziness, speech difficulty, weakness, numbness and headaches.    Allergies  Review of patient's allergies indicates no known allergies.  Home Medications  No current outpatient prescriptions on file.  BP 157/103  Pulse 70  Temp(Src) 98.4 F (36.9  C) (Oral)  Resp 16  SpO2 98%  Physical Exam  Nursing note and vitals reviewed. Constitutional: He appears well-developed and well-nourished. He is active.  Non-toxic appearance. He does not have a sickly appearance. He does not appear ill. No distress.  HENT:  Head: Normocephalic.    Right Ear: Tympanic membrane normal.  Left Ear: Tympanic membrane normal.  Mouth/Throat: No oropharyngeal exudate.  Eyes: Conjunctivae are normal.  Neck: Neck supple. No JVD present. No tracheal deviation present. No thyromegaly present.  Cardiovascular: Normal rate.   Pulmonary/Chest: Effort normal.  Lymphadenopathy:    He has no cervical adenopathy.  Neurological: He is alert. No cranial nerve deficit. He exhibits normal muscle tone. Coordination normal.  Skin: No rash noted. No erythema. No pallor.  Psychiatric: He has a normal mood and affect.    ED Course  Procedures (including critical care time)  Labs Reviewed - No data to display No results found.   1. Left facial swelling       MDM    66 year old male presents to urgent care with discomfort in marked swelling to left temporal and ZYGOMATIC and L mandibular regions- swelling has increased within the last 3-4 weeks to the point that has problems with mastication. Patient describes that he had some benign tumors intervened 10 years ago in [place]. Unknown pathology. The patient will probably benefit from a facial CT to further characterize this facial swelling. Patient denies having a primary care Dr. given the fact the patient has been having troubles with mastication and as he describes the swelling has  doubled in size in the last 2 weeks perhaps more immediate imaging will be necessary.        Jimmie Molly, MD 11/19/11 [id number]

## 2011-11-19 NOTE — Discharge Instructions (Signed)
You may have a cancer in the left side of your face.   Call [facility] ENT department to make an appointment with Dr. Nonie Hoyer.  [phone].   Return for worse symptoms.

## 2014-12-18 ENCOUNTER — Emergency Department (HOSPITAL_COMMUNITY): Payer: Medicare Other

## 2014-12-18 ENCOUNTER — Emergency Department (HOSPITAL_COMMUNITY)
Admission: EM | Admit: 2014-12-18 | Discharge: 2014-12-18 | Disposition: A | Discharge status: Home / Self Care | Payer: Medicare Other | Attending: Emergency Medicine | Admitting: Emergency Medicine

## 2014-12-18 ENCOUNTER — Encounter (HOSPITAL_COMMUNITY): Payer: Self-pay | Admitting: Emergency Medicine

## 2014-12-18 DIAGNOSIS — R220 Localized swelling, mass and lump, head: Secondary | ICD-10-CM

## 2014-12-18 DIAGNOSIS — Z7982 Long term (current) use of aspirin: Secondary | ICD-10-CM | POA: Insufficient documentation

## 2014-12-18 LAB — CBC WITH DIFFERENTIAL/PLATELET
BASOS ABS: 0 10*3/uL (ref 0.0–0.1)
BASOS PCT: 1 % (ref 0–1)
EOS PCT: 0 % (ref 0–5)
Eosinophils Absolute: 0 10*3/uL (ref 0.0–0.7)
HEMATOCRIT: 44.5 % (ref 39.0–52.0)
HEMOGLOBIN: 14.3 g/dL (ref 13.0–17.0)
LYMPHS ABS: 2.1 10*3/uL (ref 0.7–4.0)
LYMPHS PCT: 58 % — AB (ref 12–46)
MCH: 29.2 pg (ref 26.0–34.0)
MCHC: 32.1 g/dL (ref 30.0–36.0)
MCV: 90.8 fL (ref 78.0–100.0)
MONOS PCT: 8 % (ref 3–12)
Monocytes Absolute: 0.3 10*3/uL (ref 0.1–1.0)
NEUTROS ABS: 1.2 10*3/uL — AB (ref 1.7–7.7)
Neutrophils Relative %: 33 % — ABNORMAL LOW (ref 43–77)
Platelets: 168 10*3/uL (ref 150–400)
RBC: 4.9 MIL/uL (ref 4.22–5.81)
RDW: 14.8 % (ref 11.5–15.5)
WBC: 3.6 10*3/uL — AB (ref 4.0–10.5)

## 2014-12-18 LAB — BASIC METABOLIC PANEL
Anion gap: 7 (ref 5–15)
BUN: 18 mg/dL (ref 6–20)
CALCIUM: 9.2 mg/dL (ref 8.9–10.3)
CO2: 28 mmol/L (ref 22–32)
CREATININE: 1.05 mg/dL (ref 0.61–1.24)
Chloride: 103 mmol/L (ref 101–111)
GFR calc Af Amer: >60 mL/min (ref >60)
GFR calc non Af Amer: >60 mL/min (ref >60)
GLUCOSE: 98 mg/dL (ref 70–99)
Potassium: 4.3 mmol/L (ref 3.5–5.1)
Sodium: 138 mmol/L (ref 135–145)

## 2014-12-18 MED ORDER — IOHEXOL 300 MG/ML  SOLN
80.0000 mL | Freq: Once | INTRAMUSCULAR | Status: AC | PRN
  Administered 2014-12-18: 80 mL via INTRAVENOUS

## 2014-12-18 MED ORDER — HYDROMORPHONE HCL 1 MG/ML IJ SOLN
0.5000 mg | Freq: Once | INTRAMUSCULAR | Status: AC
  Administered 2014-12-18: 0.5 mg via INTRAVENOUS
  Filled 2014-12-18: qty 1

## 2014-12-18 MED ORDER — OXYCODONE-ACETAMINOPHEN 5-325 MG PO TABS: 1.0000 | ORAL_TABLET | Freq: Four times a day (QID) | ORAL | Status: DC | PRN

## 2014-12-18 NOTE — ED Provider Notes (Signed)
CSN: [id number]     Arrival date & time 12/18/14  1122 History   First MD Initiated Contact with Patient 12/18/14 1234     Chief Complaint  Patient presents with  . Facial Swelling     (Consider location/radiation/quality/duration/timing/severity/associated sxs/prior Treatment) Patient is a 69 y.o. male presenting with facial injury. The history is provided by the patient.  Facial Injury Injury mechanism: spontaneous. Location:  L cheek Time since incident:  1 month Pain details:    Quality:  Aching   Severity:  Moderate   Duration:  1 month   Timing:  Constant   Progression:  Unchanged Chronicity:  New Foreign body present:  No foreign bodies Relieved by:  Nothing Worsened by:  Nothing tried Ineffective treatments:  NSAIDs Associated symptoms: no headaches, no nausea, no neck pain, no rhinorrhea and no vomiting     History reviewed. No pertinent past medical history. Past Surgical History  Procedure Laterality Date  . Tumor excision     No family history on file. History  Substance Use Topics  . Smoking status: Never Smoker   . Smokeless tobacco: Not on file  . Alcohol Use: No    Review of Systems  Constitutional: Negative for fever.  HENT: Positive for facial swelling. Negative for drooling and rhinorrhea.   Eyes: Negative for pain.  Respiratory: Negative for cough and shortness of breath.   Cardiovascular: Negative for chest pain and leg swelling.  Gastrointestinal: Negative for nausea, vomiting, abdominal pain and diarrhea.  Genitourinary: Negative for dysuria and hematuria.  Musculoskeletal: Negative for gait problem and neck pain.  Skin: Negative for color change.  Neurological: Negative for numbness and headaches.  Hematological: Negative for adenopathy.  Psychiatric/Behavioral: Negative for behavioral problems.  All other systems reviewed and are negative.     Allergies  Review of patient's allergies indicates no known allergies.  Home  Medications   Prior to Admission medications   Medication Sig Start Date End Date Taking? Authorizing Provider  aspirin 325 MG tablet Take 325 mg by mouth daily. pain   Yes Historical Provider, MD  diphenhydramine-acetaminophen (TYLENOL PM) 25-500 MG TABS Take 4 tablets by mouth at bedtime.   Yes Historical Provider, MD   BP 162/97 mmHg  Pulse 66  Temp(Src) 98.4 F (36.9 C) (Oral)  Resp 17  Ht 6\' 1"  (1.854 m)  Wt 167 lb (75.751 kg)  BMI 22.04 kg/m2  SpO2 97% Physical Exam  Constitutional: He is oriented to person, place, and time. He appears well-developed and well-nourished.  HENT:  Head: Atraumatic.  Right Ear: External ear normal.  Left Ear: External ear normal.  Nose: Nose normal.  Mouth/Throat: Oropharynx is clear and moist. No oropharyngeal exudate.  Normal-appearing tympanic membranes bilaterally.  Mild fluctuant swelling to the left temporal area and left cheek.  No erythema or induration noted.  Eyes: Conjunctivae and EOM are normal. Pupils are equal, round, and reactive to light.  Neck: Normal range of motion. Neck supple.  Cardiovascular: Normal rate, regular rhythm, normal heart sounds and intact distal pulses.  Exam reveals no gallop and no friction rub.   No murmur heard. Pulmonary/Chest: Effort normal and breath sounds normal. No respiratory distress. He has no wheezes.  Abdominal: Soft. Bowel sounds are normal. He exhibits no distension. There is no tenderness. There is no rebound and no guarding.  Musculoskeletal: Normal range of motion. He exhibits no edema or tenderness.  Neurological: He is alert and oriented to person, place, and time.  Skin: Skin is  warm and dry.  Psychiatric: He has a normal mood and affect. His behavior is normal.  Nursing note and vitals reviewed.   ED Course  Procedures (including critical care time) Labs Review Labs Reviewed  CBC WITH DIFFERENTIAL/PLATELET - Abnormal; Notable for the following:    WBC 3.6 (*)    Neutrophils  Relative % 33 (*)    Lymphocytes Relative 58 (*)    Neutro Abs 1.2 (*)    All other components within normal limits  BASIC METABOLIC PANEL    Imaging Review Ct Maxillofacial W/cm  12/18/2014   CLINICAL DATA:  Left facial swelling.  EXAM: CT MAXILLOFACIAL WITH CONTRAST  TECHNIQUE: Multidetector CT imaging of the maxillofacial structures was performed with intravenous contrast. Multiplanar CT image reconstructions were also generated. A small metallic BB was placed on the right temple in order to reliably differentiate right from left.  CONTRAST:  80mL OMNIPAQUE IOHEXOL 300 MG/ML  SOLN  COMPARISON:  CT face 11/19/2011  FINDINGS: Large mass lesion centered in the left masticator space is similar to the prior study but is slightly larger. The mass has a significant cystic component as well as small areas of calcific matrix, possibly chondroid matrix. The mass measures approximately 4.2 x 4.5 cm on axial images at the level the maxillary sinus. Cystic change is present within the left masseter muscle and extends into the left platysmas muscle. There is also extension into the pterygoid muscles. Chronic bony changes are present with thinning of the zygomatic arch which has progressed. There is progressive thinning of the lateral wall the maxillary sinus which is displaced medially. No aggressive bony destruction.  The left mandibular condyle is displaced anteriorly and dislocated from the mandibular fossa. The mandibular condyle is abnormal in shape and appears to be remodeled due to process described above. This appearance is similar to the prior study. The posterior wall of the mass has well-defined calcification similar to the prior study. Right TMJ normal.  Mild mucosal edema and air-fluid level right maxillary sinus. Patient is edentulous. No evidence of dentigerous tumor. Central skull base is normal. Mastoid sinus is clear bilaterally.  Negative orbit.  No intracranial abnormality on limited imaging.   IMPRESSION: Large mass lesion left masticator space shows slight interval growth since 2013. This is predominately cystic with areas of calcific matrix possibly chondroid calcifications. There is bony remodeling which has progressed in the interval indicative of a slow-growing process. No aggressive bony destruction. Left mandibular condyle is abnormal and dislocated, this is unchanged from the prior study.  Differential diagnosis includes slow-growing tumor such as a chondroma or chondrosarcoma or chondromyxoid fibroma. Infection not considered likely given the longstanding nature of the process.   Electronically Signed   By: Marlan Palauharles  Clark M.D.   On: 12/18/2014 14:51     EKG Interpretation None          MDM   Final diagnoses:  Facial swelling    1:32 PM 69 y.o. male who presents with left-sided facial swelling over the last month. 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 He states that [facility] and followed [facility]. He states that they plan to do surgery but his symptoms resolved and he did not return. He also states that he had similar issues on the right side of his face back in 1999 and had [facility]. He presents now with recurrent left-sided facial swelling  and pain with chewing. He has fluctuant swelling over the left cheek and left temporal area. No evidence of erythema or induration. We'll get screening lab work and CT.  CT results discussed w/ pt. In review of his visit in 2013. Case was discussed w/ ENT at that time who [facility] the [facility] the case. I think f[facility] given that he has had surgery for this there previously, he and his wife agree. 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  I have discussed the diagnosis/risks/treatment options with the patient and family and believe the pt to be eligible  for discharge home to follow-up with ENT at [facility]. We also discussed returning to the ED immediately if new or worsening sx occur. We discussed the sx which are most concerning (e.g., worsening swelling, worsening pain, redness, fever, HA) that necessitate immediate return. Medications administered to the patient during their visit and any new prescriptions provided to the patient are listed below.  Medications given during this visit Medications  HYDROmorphone (DILAUDID) injection 0.5 mg (0.5 mg Intravenous Given 12/18/14 1329)  iohexol (OMNIPAQUE) 300 MG/ML solution 80 mL (80 mLs Intravenous Contrast Given 12/18/14 1415)    Discharge Medication List as of 12/18/2014  3:49 PM    START taking these medications   Details  oxyCODONE-acetaminophen (PERCOCET) 5-325 MG per tablet Take 1-2 tablets by mouth every 6 (six) hours as needed for moderate pain., Starting 12/18/2014, Until Discontinued, Print         Purvis Sheffield, MD 12/19/14 [phone]

## 2014-12-18 NOTE — ED Notes (Signed)
Pt c/o facial swelling on left side of face that has gotten bigger of the past month.  Pt states that it is painful.  Visitor reports that swelling causing him to have headaches bc she catches him holding the left side of his head.

## 2014-12-18 NOTE — ED Notes (Signed)
He has an area of firm edema at left zygoma and at left temporal area.  He states he noticed the zygoma area swelling first about a month and a half ago; and the left temporal area swelling occurred about two weeks ago.  He denies fever/cough, nor any other sign of recent or current illness.

## 2016-04-30 ENCOUNTER — Emergency Department (HOSPITAL_COMMUNITY): Payer: PPO

## 2016-04-30 ENCOUNTER — Encounter (HOSPITAL_COMMUNITY): Payer: Self-pay

## 2016-04-30 ENCOUNTER — Emergency Department (HOSPITAL_COMMUNITY)
Admission: EM | Admit: 2016-04-30 | Discharge: 2016-04-30 | Disposition: A | Discharge status: Home / Self Care | Payer: PPO | Attending: Emergency Medicine | Admitting: Emergency Medicine

## 2016-04-30 DIAGNOSIS — S82002A Unspecified fracture of left patella, initial encounter for closed fracture: Secondary | ICD-10-CM | POA: Insufficient documentation

## 2016-04-30 DIAGNOSIS — W208XXA Other cause of strike by thrown, projected or falling object, initial encounter: Secondary | ICD-10-CM | POA: Insufficient documentation

## 2016-04-30 DIAGNOSIS — Z7982 Long term (current) use of aspirin: Secondary | ICD-10-CM | POA: Diagnosis not present

## 2016-04-30 DIAGNOSIS — S8992XA Unspecified injury of left lower leg, initial encounter: Secondary | ICD-10-CM | POA: Diagnosis not present

## 2016-04-30 DIAGNOSIS — Y999 Unspecified external cause status: Secondary | ICD-10-CM | POA: Insufficient documentation

## 2016-04-30 DIAGNOSIS — S82032A Displaced transverse fracture of left patella, initial encounter for closed fracture: Secondary | ICD-10-CM | POA: Diagnosis not present

## 2016-04-30 DIAGNOSIS — Y929 Unspecified place or not applicable: Secondary | ICD-10-CM | POA: Insufficient documentation

## 2016-04-30 DIAGNOSIS — Y939 Activity, unspecified: Secondary | ICD-10-CM | POA: Insufficient documentation

## 2016-04-30 MED ORDER — IBUPROFEN 400 MG PO TABS
600.0000 mg | ORAL_TABLET | Freq: Once | ORAL | Status: AC
  Administered 2016-04-30: 600 mg via ORAL
  Filled 2016-04-30: qty 1

## 2016-04-30 MED ORDER — IBUPROFEN 600 MG PO TABS: 600.0000 mg | ORAL_TABLET | Freq: Four times a day (QID) | ORAL | 0 refills | Status: DC | PRN

## 2016-04-30 MED ORDER — OXYCODONE-ACETAMINOPHEN 5-325 MG PO TABS: 1.0000 | ORAL_TABLET | ORAL | 0 refills | Status: DC | PRN

## 2016-04-30 NOTE — Discharge Instructions (Signed)
Take your medications as prescribed as an for pain relief. I recommend resting, elevating applying ice to her knee for 15 minutes 3-4 times daily to help with pain and swelling. Continue wearing your knee immobilizer and using crutches to remain nonweightbearing until you follow up with orthopedics next week. Call the orthopedic office listed above to schedule a follow-up appointment for next week regarding your patellar fracture. Please return to the Emergency Department if symptoms worsen or new onset of fever, redness, swelling, warmth, numbness, tingling, weakness.

## 2016-04-30 NOTE — ED Provider Notes (Signed)
[facility]-EMERGENCY DEPT Provider Note   CSN: [id number] Arrival date & time: 04/30/16  1258  By signing my name below, I, Linna DarnerRussell Turner, attest that this documentation has been prepared under the direction and in the presence of Melburn HakeNicole Saira Kramme, [place]. Electronically Signed: Linna Darnerussell Turner, Scribe. 04/30/2016. 1:30 PM.  History   Chief Complaint Chief Complaint  Patient presents with  . Knee Injury    The history is provided by the patient. No language interpreter was used.     HPI Comments: Patrick Gonzalez is a 70 y.o. male who presents to the Emergency Department complaining of sudden onset, constant, left knee pain and swelling beginning yesterday. Pt reports a plate from an industrial car jack fell on his left knee while he was in a squatting position. Pt states that his pain is non-radiating. He notes he has not ambulated since the incident due to pain. He endorses pain exacerbation with flexion of his left knee and with bearing weight on his left knee. Pt states he used an OTC muscle relaxer rub on his left knee yesterday with no relief of his pain. He has not tried any oral medications for his left knee pain. Pt denies numbness/tingling, neuro deficits, or any other associated symptoms.  No past medical history on file.  There are no active problems to display for this patient.   Past Surgical History:  Procedure Laterality Date  . TUMOR EXCISION         Home Medications    Prior to Admission medications   Medication Sig Start Date End Date Taking? Authorizing Provider  aspirin 325 MG tablet Take 325 mg by mouth daily. pain    Historical Provider, MD  diphenhydramine-acetaminophen (TYLENOL PM) 25-500 MG TABS Take 4 tablets by mouth at bedtime.    Historical Provider, MD  ibuprofen (ADVIL,MOTRIN) 600 MG tablet Take 1 tablet (600 mg total) by mouth every 6 (six) hours as needed. 04/30/16   Barrett HenleNicole Elizabeth Idil Maslanka, PA-C  oxyCODONE-acetaminophen (PERCOCET/ROXICET) 5-325 MG  tablet Take 1 tablet by mouth every 4 (four) hours as needed for severe pain. 04/30/16   Barrett HenleNicole Elizabeth Neela Zecca, PA-C    Family History No family history on file.  Social History Social History  Substance Use Topics  . Smoking status: Never Smoker  . Smokeless tobacco: Never Used  . Alcohol use No     Allergies   Review of patient's allergies indicates no known allergies.   Review of Systems Review of Systems  Musculoskeletal: Positive for arthralgias (left knee) and joint swelling (left knee).  Neurological: Negative for numbness.       Negative for sensation loss.  All other systems reviewed and are negative.   Physical Exam Updated Vital Signs BP 121/93 (BP Location: Right Arm)   Pulse 89   Temp 99 F (37.2 C) (Oral)   Resp 17   SpO2 98%   Physical Exam  Constitutional: He is oriented to person, place, and time. He appears well-developed and well-nourished.  HENT:  Head: Normocephalic and atraumatic.  Eyes: Conjunctivae and EOM are normal. Right eye exhibits no discharge. Left eye exhibits no discharge. No scleral icterus.  Cardiovascular: Normal rate and intact distal pulses.   Pulmonary/Chest: Effort normal.  Musculoskeletal:       Left knee: He exhibits decreased range of motion (due to pain and swelling), swelling and effusion. He exhibits no ecchymosis, no deformity, no laceration, no erythema, normal alignment, no LCL laxity, normal patellar mobility and no MCL laxity. Tenderness found.  Moderate swelling and diffuse tenderness to left knee with decreased ROM. Patient able to actively extend left leg approximately 90% of full extension. Sensation grossly intact. 2+ PT pulse. Patient unable to stand and bear weight on left leg due to reported pain.  Neurological: He is alert and oriented to person, place, and time.  Nursing note and vitals reviewed.   ED Treatments / Results  Labs (all labs ordered are listed, but only abnormal results are displayed) Labs  Reviewed - No data to display  EKG  EKG Interpretation None       Radiology Dg Knee Complete 4 Views Left  Result Date: 04/30/2016 CLINICAL DATA:  Fall.  Swelling. EXAM: LEFT KNEE - COMPLETE 4+ VIEW COMPARISON:  No recent. FINDINGS: Patellar fracture is noted with slight displacement of fracture fragments. Tiny bony density noted adjacent to the medial tibial plateau. This may represent tiny avulsion fracture, age undetermined. Prominent knee joint effusion. Vascular calcification . IMPRESSION: 1. Patellar fracture with slight displacement. 2. Tiny bony density noted adjacent to the medial tibial plateau, this may represent a tiny fracture fragment, possibly old. 3. Prominent knee joint effusion. 4. Peripheral vascular disease. Electronically Signed   By: Maisie Fus  Register   On: 04/30/2016 13:30    Procedures Procedures (including critical care time)  DIAGNOSTIC STUDIES: Oxygen Saturation is 98% on RA, normal by my interpretation.    COORDINATION OF CARE: 1:36 PM Discussed treatment plan with pt at bedside and pt agreed to plan.  Medications Ordered in ED Medications  ibuprofen (ADVIL,MOTRIN) tablet 600 mg (not administered)     Initial Impression / Assessment and Plan / ED Course  I have reviewed the triage vital signs and the nursing notes.  Pertinent labs & imaging results that were available during my care of the patient were reviewed by me and considered in my medical decision making (see chart for details).  Clinical Course   Patient presents with left knee pain and swelling after having a metal plate fall on his knee yesterday while using a carjack. VSS. Exam revealed moderate swelling and diffuse tenderness to left knee without any obvious deformities. Patient unable to fully extend or flex the knee due to reported pain. Unable to bear weight due to reported pain. Left leg otherwise are vascular intact. Left knee x-ray revealed patellar fracture with slight displacement,  prominent knee joint effusion present. Patient placed in knee immobilizer and given crutches, ibuprofen and ice in the ED. Discussed results and plan for discharge with patient. Plan to discharge patient home with pain meds, NSAIDs, symptomatically treatment and advised patient to remain nonweightbearing until he follows up with orthopedics next week. Discussed return precautions with patient.  I personally performed the services described in this documentation, which was scribed in my presence. The recorded information has been reviewed and is accurate.   Final Clinical Impressions(s) / ED Diagnoses   Final diagnoses:  Patellar fracture, left, closed, initial encounter    New Prescriptions New Prescriptions   IBUPROFEN (ADVIL,MOTRIN) 600 MG TABLET    Take 1 tablet (600 mg total) by mouth every 6 (six) hours as needed.   OXYCODONE-ACETAMINOPHEN (PERCOCET/ROXICET) 5-325 MG TABLET    Take 1 tablet by mouth every 4 (four) hours as needed for severe pain.     Satira Sark [place], [place] 04/30/16 1400    Zadie Rhine, MD 05/03/16 [phone]

## 2016-04-30 NOTE — Progress Notes (Signed)
Orthopedic Tech Progress Note Patient Details:  Patrick BurnCelesta E Gonzalez 03/21/1946 [id number]  Ortho Devices Type of Ortho Device: Crutches, Knee Immobilizer Ortho Device/Splint Location: lle Ortho Device/Splint Interventions: Application   [name] 04/30/2016, 2:21 PM

## 2016-04-30 NOTE — ED Notes (Signed)
Called ortho to come place immobilizer. 

## 2016-04-30 NOTE — ED Notes (Signed)
Patient in xray 

## 2016-04-30 NOTE — ED Triage Notes (Signed)
Pt reports a car jack fell onto his left knee yesterday and reports swelling and pain to his left knee.

## 2017-07-21 ENCOUNTER — Other Ambulatory Visit: Payer: Self-pay

## 2017-07-21 ENCOUNTER — Emergency Department (HOSPITAL_COMMUNITY): Payer: PPO

## 2017-07-21 ENCOUNTER — Emergency Department (HOSPITAL_COMMUNITY)
Admission: EM | Admit: 2017-07-21 | Discharge: 2017-07-21 | Disposition: A | Discharge status: Home / Self Care | Payer: PPO | Attending: Emergency Medicine | Admitting: Emergency Medicine

## 2017-07-21 ENCOUNTER — Encounter (HOSPITAL_COMMUNITY): Payer: Self-pay

## 2017-07-21 DIAGNOSIS — R072 Precordial pain: Secondary | ICD-10-CM | POA: Insufficient documentation

## 2017-07-21 DIAGNOSIS — M545 Low back pain, unspecified: Secondary | ICD-10-CM

## 2017-07-21 DIAGNOSIS — M25572 Pain in left ankle and joints of left foot: Secondary | ICD-10-CM | POA: Diagnosis not present

## 2017-07-21 DIAGNOSIS — R079 Chest pain, unspecified: Secondary | ICD-10-CM | POA: Insufficient documentation

## 2017-07-21 DIAGNOSIS — M549 Dorsalgia, unspecified: Secondary | ICD-10-CM | POA: Diagnosis not present

## 2017-07-21 DIAGNOSIS — S299XXA Unspecified injury of thorax, initial encounter: Secondary | ICD-10-CM | POA: Diagnosis not present

## 2017-07-21 DIAGNOSIS — S99912A Unspecified injury of left ankle, initial encounter: Secondary | ICD-10-CM | POA: Diagnosis not present

## 2017-07-21 LAB — I-STAT CHEM 8, ED
BUN: 15 mg/dL (ref 6–20)
CHLORIDE: 101 mmol/L (ref 101–111)
CREATININE: 0.9 mg/dL (ref 0.61–1.24)
Calcium, Ion: 1.14 mmol/L — ABNORMAL LOW (ref 1.15–1.40)
GLUCOSE: 100 mg/dL — AB (ref 65–99)
HCT: 44 % (ref 39.0–52.0)
Hemoglobin: 15 g/dL (ref 13.0–17.0)
POTASSIUM: 4 mmol/L (ref 3.5–5.1)
Sodium: 140 mmol/L (ref 135–145)
TCO2: 27 mmol/L (ref 22–32)

## 2017-07-21 LAB — I-STAT TROPONIN, ED: TROPONIN I, POC: 0 ng/mL (ref 0.00–0.08)

## 2017-07-21 MED ORDER — ACETAMINOPHEN 325 MG PO TABS
650.0000 mg | ORAL_TABLET | Freq: Once | ORAL | Status: AC
  Administered 2017-07-21: 650 mg via ORAL
  Filled 2017-07-21: qty 2

## 2017-07-21 MED ORDER — ACETAMINOPHEN 325 MG PO TABS: 650.0000 mg | ORAL_TABLET | Freq: Four times a day (QID) | ORAL | 0 refills | Status: DC | PRN

## 2017-07-21 NOTE — ED Notes (Signed)
Patient transported to X-ray 
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
## 2017-07-21 NOTE — Progress Notes (Signed)
Orthopedic Tech Progress Note Patient Details:  Patrick Gonzalez 12/24/1945 [id number]  Ortho Devices Type of Ortho Device: ASO Ortho Device/Splint Location: lle Ortho Device/Splint Interventions: Application   Post Interventions Patient Tolerated: Well Instructions Provided: Care of device   [name], [name] 07/21/2017, 2:30 PM

## 2017-07-21 NOTE — ED Notes (Signed)
Ortho paged - will come to place ASO

## 2017-07-24 ENCOUNTER — Emergency Department (HOSPITAL_COMMUNITY)
Admission: EM | Admit: 2017-07-24 | Discharge: 2017-07-24 | Disposition: A | Discharge status: Home / Self Care | Payer: PPO | Attending: Emergency Medicine | Admitting: Emergency Medicine

## 2017-07-24 ENCOUNTER — Encounter (HOSPITAL_COMMUNITY): Payer: Self-pay | Admitting: *Deleted

## 2017-07-24 ENCOUNTER — Other Ambulatory Visit: Payer: Self-pay

## 2017-07-24 ENCOUNTER — Emergency Department (HOSPITAL_COMMUNITY): Payer: PPO

## 2017-07-24 DIAGNOSIS — R51 Headache: Secondary | ICD-10-CM | POA: Diagnosis not present

## 2017-07-24 DIAGNOSIS — R110 Nausea: Secondary | ICD-10-CM | POA: Diagnosis not present

## 2017-07-24 DIAGNOSIS — S0990XA Unspecified injury of head, initial encounter: Secondary | ICD-10-CM | POA: Diagnosis not present

## 2017-07-24 DIAGNOSIS — Y939 Activity, unspecified: Secondary | ICD-10-CM | POA: Diagnosis not present

## 2017-07-24 DIAGNOSIS — Y999 Unspecified external cause status: Secondary | ICD-10-CM | POA: Insufficient documentation

## 2017-07-24 DIAGNOSIS — R519 Headache, unspecified: Secondary | ICD-10-CM

## 2017-07-24 DIAGNOSIS — Y92410 Unspecified street and highway as the place of occurrence of the external cause: Secondary | ICD-10-CM | POA: Diagnosis not present
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
## 2017-07-24 NOTE — Discharge Instructions (Signed)
Please see the information and instructions below regarding your visit.  Your diagnoses today include:  1. Left-sided headache     You were seen and treated in the emergency department today for headache. Fortunately, your vitals, exam, and work-up is reassuring with no apparent emergent cause for your headache at this time.   Anytime there is a bump to the head there is a risk of concussion.  I would like you to follow-up with your primary care provider if you continue to have ongoing headaches.  Concussions are characterized by microtrauma in the brain that causes headaches, difficulty with concentration, or memory difficulties.   Tests performed today include: See side panel of your discharge paperwork for testing performed today. Vital signs are listed at the bottom of these instructions.   Your CT scan is reassuring today.  We see some postsurgical changes from the partial removal of your jaw, but there is no bleeding or masses in your brain.  Medications prescribed:    Try to avoid daily or regular use of tylenol, aspirin, ibuprofen, and other overt-the-counter pain medications as this can contribute to rebound headaches.   Take any prescribed medications only as prescribed, and any over the counter medications only as directed on the packaging.  Please make sure that you are not taking more than 4 g of Tylenol in 1 day.  This would mean no more than 650 mg every 6 hours.  Home care instructions:   Drink plenty of fluids at home. This will help with your headache. Be cautious with caffeine use, as this can cause your headache to rebound when the effects wear off. If you drink more than 2 cups of coffee/caffeinated tea, or caffeinated soda per day, I suggest you wean down that amount.  Please follow any educational materials contained in this packet.   Follow-up instructions: Please follow-up with your primary care provider in as soon as possible for further evaluation of your  symptoms if they are not completely improved.   Return instructions:  Please return to the Emergency Department if you experience worsening symptoms. It is VERY important that you monitor your symptoms at home. If you develop worsening headache, new fever, new neck stiffness, rash, focal weakness or numbness, or any other new or concerning symptoms, please return to the ED immediately, as these may be signs that your headache has become a potentially serious and life-threatening condition.  Please return if you have any other emergent concerns.  Additional Information:   Your vital signs today were: BP (!) 159/75 (BP Location: Right Arm)    Pulse 70    Temp 98.2 F (36.8 C) (Oral)    Resp 16    SpO2 100%  If your blood pressure (BP) was elevated on multiple readings during this visit above 130 for the top number or above 80 for the bottom number, please have this repeated by your primary care provider within one month. --------------  Thank you for allowing us to participate in your care today.

## 2017-07-27 ENCOUNTER — Other Ambulatory Visit: Payer: Self-pay

## 2017-07-27 DIAGNOSIS — R51 Headache: Secondary | ICD-10-CM | POA: Diagnosis not present

## 2017-07-27 DIAGNOSIS — M549 Dorsalgia, unspecified: Secondary | ICD-10-CM | POA: Diagnosis not present

## 2017-08-03 ENCOUNTER — Emergency Department (HOSPITAL_COMMUNITY)
Admission: EM | Admit: 2017-08-03 | Discharge: 2017-08-03 | Disposition: A | Discharge status: Home / Self Care | Payer: PPO | Attending: Emergency Medicine | Admitting: Emergency Medicine

## 2017-08-03 ENCOUNTER — Emergency Department (HOSPITAL_COMMUNITY): Payer: PPO

## 2017-08-03 DIAGNOSIS — M79662 Pain in left lower leg: Secondary | ICD-10-CM | POA: Diagnosis not present

## 2017-08-03 DIAGNOSIS — S0993XA Unspecified injury of face, initial encounter: Secondary | ICD-10-CM | POA: Diagnosis not present

## 2017-08-03 DIAGNOSIS — Z7982 Long term (current) use of aspirin: Secondary | ICD-10-CM | POA: Insufficient documentation

## 2017-08-03 DIAGNOSIS — R6884 Jaw pain: Secondary | ICD-10-CM | POA: Insufficient documentation

## 2017-08-03 DIAGNOSIS — Y92410 Unspecified street and highway as the place of occurrence of the external cause: Secondary | ICD-10-CM | POA: Diagnosis not present
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
## 2017-08-03 NOTE — Discharge Instructions (Signed)
The CT scan of your face shows no fracture or dislocation. You will need to follow up with your doctor to discuss further evaluation of your symptoms and follow up with the doctor that did your facial surgery for the tumor. Take tylenol as needed for pain.

## 2017-08-03 NOTE — ED Notes (Signed)
1x call back for fast track

## 2017-09-20 DIAGNOSIS — R8290 Unspecified abnormal findings in urine: Secondary | ICD-10-CM | POA: Diagnosis not present

## 2017-09-20 DIAGNOSIS — Z1211 Encounter for screening for malignant neoplasm of colon: Secondary | ICD-10-CM | POA: Diagnosis not present

## 2017-09-20 DIAGNOSIS — Z136 Encounter for screening for cardiovascular disorders: Secondary | ICD-10-CM | POA: Diagnosis not present

## 2017-09-20 DIAGNOSIS — Z1389 Encounter for screening for other disorder: Secondary | ICD-10-CM | POA: Diagnosis not present

## 2017-09-20 DIAGNOSIS — Z125 Encounter for screening for malignant neoplasm of prostate: Secondary | ICD-10-CM | POA: Diagnosis not present

## 2017-09-20 DIAGNOSIS — G4700 Insomnia, unspecified: Secondary | ICD-10-CM | POA: Diagnosis not present

## 2017-09-20 DIAGNOSIS — R51 Headache: Secondary | ICD-10-CM | POA: Diagnosis not present

## 2017-09-20 DIAGNOSIS — R7309 Other abnormal glucose: Secondary | ICD-10-CM | POA: Diagnosis not present

## 2017-09-20 DIAGNOSIS — Z23 Encounter for immunization: Secondary | ICD-10-CM | POA: Diagnosis not present

## 2017-09-20 DIAGNOSIS — M952 Other acquired deformity of head: Secondary | ICD-10-CM | POA: Diagnosis not present

## 2017-09-20 DIAGNOSIS — Z0000 Encounter for general adult medical examination without abnormal findings: Secondary | ICD-10-CM | POA: Diagnosis not present

## 2017-10-17 DIAGNOSIS — R310 Gross hematuria: Secondary | ICD-10-CM | POA: Diagnosis not present

## 2017-10-17 DIAGNOSIS — R3121 Asymptomatic microscopic hematuria: Secondary | ICD-10-CM | POA: Diagnosis not present

## 2017-10-17 DIAGNOSIS — R972 Elevated prostate specific antigen [PSA]: Secondary | ICD-10-CM | POA: Diagnosis not present

## 2017-10-26 DIAGNOSIS — R3129 Other microscopic hematuria: Secondary | ICD-10-CM | POA: Diagnosis not present

## 2017-10-26 DIAGNOSIS — R310 Gross hematuria: Secondary | ICD-10-CM | POA: Diagnosis not present

## 2017-11-01 DIAGNOSIS — N5201 Erectile dysfunction due to arterial insufficiency: Secondary | ICD-10-CM | POA: Diagnosis not present

## 2017-11-01 DIAGNOSIS — R310 Gross hematuria: Secondary | ICD-10-CM | POA: Diagnosis not present

## 2017-11-01 DIAGNOSIS — R972 Elevated prostate specific antigen [PSA]: Secondary | ICD-10-CM | POA: Diagnosis not present

## 2017-11-01 DIAGNOSIS — N400 Enlarged prostate without lower urinary tract symptoms: Secondary | ICD-10-CM | POA: Diagnosis not present

## 2017-11-01 DIAGNOSIS — N210 Calculus in bladder: Secondary | ICD-10-CM | POA: Diagnosis not present

## 2017-11-08 ENCOUNTER — Other Ambulatory Visit: Payer: Self-pay | Admitting: Urology

## 2017-11-08 ENCOUNTER — Other Ambulatory Visit (HOSPITAL_COMMUNITY): Payer: Self-pay | Admitting: Urology

## 2017-11-08 DIAGNOSIS — R972 Elevated prostate specific antigen [PSA]: Secondary | ICD-10-CM

## 2017-11-10 ENCOUNTER — Encounter (HOSPITAL_BASED_OUTPATIENT_CLINIC_OR_DEPARTMENT_OTHER): Payer: Self-pay | Admitting: *Deleted

## 2017-11-10 ENCOUNTER — Other Ambulatory Visit: Payer: Self-pay
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
## 2017-11-14 ENCOUNTER — Ambulatory Visit (HOSPITAL_COMMUNITY)
Admission: RE | Admit: 2017-11-14 | Discharge: 2017-11-14 | Disposition: A | Discharge status: Home / Self Care | Payer: PPO | Source: Ambulatory Visit | Attending: Urology | Admitting: Urology

## 2017-11-14 ENCOUNTER — Encounter (HOSPITAL_BASED_OUTPATIENT_CLINIC_OR_DEPARTMENT_OTHER)
Admission: RE | Disposition: A | Discharge status: Home / Self Care | Payer: Self-pay | Source: Ambulatory Visit | Attending: Urology

## 2017-11-14 ENCOUNTER — Other Ambulatory Visit: Payer: Self-pay

## 2017-11-14 ENCOUNTER — Ambulatory Visit (HOSPITAL_BASED_OUTPATIENT_CLINIC_OR_DEPARTMENT_OTHER): Payer: PPO | Admitting: Anesthesiology

## 2017-11-14 ENCOUNTER — Encounter (HOSPITAL_BASED_OUTPATIENT_CLINIC_OR_DEPARTMENT_OTHER): Payer: Self-pay | Admitting: *Deleted

## 2017-11-14 ENCOUNTER — Ambulatory Visit (HOSPITAL_BASED_OUTPATIENT_CLINIC_OR_DEPARTMENT_OTHER)
Admission: RE | Admit: 2017-11-14 | Discharge: 2017-11-14 | Disposition: A | Discharge status: Home / Self Care | Payer: PPO | Source: Ambulatory Visit | Attending: Urology | Admitting: Urology

## 2017-11-14 DIAGNOSIS — M545 Low back pain: Secondary | ICD-10-CM | POA: Diagnosis not present

## 2017-11-14 DIAGNOSIS — N323 Diverticulum of bladder: Secondary | ICD-10-CM | POA: Diagnosis not present

## 2017-11-14 DIAGNOSIS — Z79899 Other long term (current) drug therapy: Secondary | ICD-10-CM | POA: Insufficient documentation

## 2017-11-14 DIAGNOSIS — K624 Stenosis of anus and rectum: Secondary | ICD-10-CM | POA: Diagnosis not present

## 2017-11-14 DIAGNOSIS — R972 Elevated prostate specific antigen [PSA]: Secondary | ICD-10-CM

## 2017-11-14 DIAGNOSIS — N400 Enlarged prostate without lower urinary tract symptoms: Secondary | ICD-10-CM | POA: Insufficient documentation

## 2017-11-14 DIAGNOSIS — Z7982 Long term (current) use of aspirin: Secondary | ICD-10-CM | POA: Insufficient documentation

## 2017-11-14 DIAGNOSIS — N210 Calculus in bladder: Secondary | ICD-10-CM | POA: Insufficient documentation

## 2017-11-14 DIAGNOSIS — R310 Gross hematuria: Secondary | ICD-10-CM | POA: Diagnosis not present

## 2017-11-14 DIAGNOSIS — N5201 Erectile dysfunction due to arterial insufficiency: Secondary | ICD-10-CM | POA: Diagnosis not present

## 2017-11-14 HISTORY — DX: Elevated prostate specific antigen (PSA): R97.20

## 2017-11-14 HISTORY — DX: Frequency of micturition: R35.0

## 2017-11-14 HISTORY — DX: Prediabetes: R73.03

## 2017-11-14 HISTORY — PX: PROSTATE BIOPSY: SHX241

## 2017-11-14 HISTORY — DX: Other chronic pain: M54.50

## 2017-11-14 HISTORY — DX: Unspecified osteoarthritis, unspecified site: M19.90

## 2017-11-14 HISTORY — DX: Other specified postprocedural states: Z98.890

## 2017-11-14 HISTORY — DX: Other chronic pain: G89.29

## 2017-11-14 HISTORY — DX: Low back pain: M54.5

## 2017-11-14 HISTORY — DX: Personal history of other diseases of the digestive system: Z87.19

## 2017-11-14 HISTORY — DX: Nocturia: R35.1

## 2017-11-14 HISTORY — PX: TRANSRECTAL ULTRASOUND: SHX5146

## 2017-11-14 HISTORY — DX: Poor urinary stream: R39.12

## 2017-11-14 LAB — POCT I-STAT 4, (NA,K, GLUC, HGB,HCT)
Glucose, Bld: 98 mg/dL (ref 65–99)
HCT: 41 % (ref 39.0–52.0)
Hemoglobin: 13.9 g/dL (ref 13.0–17.0)
Potassium: 4.2 mmol/L (ref 3.5–5.1)
Sodium: 142 mmol/L (ref 135–145)

## 2017-11-14 SURGERY — ULTRASOUND, RECTAL APPROACH
Anesthesia: General | Site: Rectum

## 2017-11-14 MED ORDER — FENTANYL CITRATE (PF) 100 MCG/2ML IJ SOLN
INTRAMUSCULAR | Status: DC | PRN
  Administered 2017-11-14: 25 ug via INTRAVENOUS

## 2017-11-14 MED ORDER — LACTATED RINGERS IV SOLN
INTRAVENOUS | Status: DC
  Administered 2017-11-14: 13:00:00 via INTRAVENOUS
  Filled 2017-11-14: qty 1000

## 2017-11-14 MED ORDER — PROMETHAZINE HCL 25 MG/ML IJ SOLN
6.2500 mg | INTRAMUSCULAR | Status: DC | PRN
  Filled 2017-11-14: qty 1

## 2017-11-14 MED ORDER — SODIUM CHLORIDE 0.9 % IV SOLN
2.0000 g | INTRAVENOUS | Status: AC
  Administered 2017-11-14: 2 g via INTRAVENOUS
  Filled 2017-11-14: qty 20

## 2017-11-14 MED ORDER — LIDOCAINE 2% (20 MG/ML) 5 ML SYRINGE
INTRAMUSCULAR | Status: DC | PRN
  Administered 2017-11-14: 40 mg via INTRAVENOUS

## 2017-11-14 MED ORDER — DEXAMETHASONE SODIUM PHOSPHATE 10 MG/ML IJ SOLN
INTRAMUSCULAR | Status: AC
  Filled 2017-11-14: qty 1

## 2017-11-14 MED ORDER — PROPOFOL 10 MG/ML IV BOLUS
INTRAVENOUS | Status: AC
  Filled 2017-11-14: qty 20

## 2017-11-14 MED ORDER — FENTANYL CITRATE (PF) 100 MCG/2ML IJ SOLN
INTRAMUSCULAR | Status: AC
  Filled 2017-11-14: qty 2

## 2017-11-14 MED ORDER — SODIUM CHLORIDE 0.9 % IV SOLN
INTRAVENOUS | Status: DC
  Administered 2017-11-14: 09:00:00 via INTRAVENOUS
  Filled 2017-11-14: qty 1000

## 2017-11-14 MED ORDER — ONDANSETRON HCL 4 MG/2ML IJ SOLN
INTRAMUSCULAR | Status: AC
  Filled 2017-11-14: qty 2

## 2017-11-14 MED ORDER — MIDAZOLAM HCL 2 MG/2ML IJ SOLN
INTRAMUSCULAR | Status: AC
  Filled 2017-11-14: qty 2

## 2017-11-14 MED ORDER — PROPOFOL 10 MG/ML IV BOLUS
INTRAVENOUS | Status: DC | PRN
  Administered 2017-11-14 (×8): 20 mg via INTRAVENOUS

## 2017-11-14 MED ORDER — MIDAZOLAM HCL 5 MG/5ML IJ SOLN
INTRAMUSCULAR | Status: DC | PRN
  Administered 2017-11-14: 2 mg via INTRAVENOUS

## 2017-11-14 MED ORDER — FENTANYL CITRATE (PF) 100 MCG/2ML IJ SOLN
25.0000 ug | INTRAMUSCULAR | Status: DC | PRN
  Filled 2017-11-14: qty 1

## 2017-11-14 MED ORDER — SODIUM CHLORIDE 0.9 % IV SOLN
INTRAVENOUS | Status: AC
  Filled 2017-11-14: qty 100

## 2017-11-14 MED ORDER — LIDOCAINE HCL 2 % IJ SOLN
INTRAMUSCULAR | Status: DC | PRN
  Administered 2017-11-14: 6 mL

## 2017-11-14 MED ORDER — LIDOCAINE 2% (20 MG/ML) 5 ML SYRINGE
INTRAMUSCULAR | Status: AC
  Filled 2017-11-14: qty 5

## 2017-11-14 MED ORDER — ONDANSETRON HCL 4 MG/2ML IJ SOLN
INTRAMUSCULAR | Status: DC | PRN
  Administered 2017-11-14: 4 mg via INTRAVENOUS

## 2017-11-14 MED ORDER — BELLADONNA ALKALOIDS-OPIUM 16.2-60 MG RE SUPP
RECTAL | Status: AC
  Filled 2017-11-14: qty 1

## 2017-11-14 MED ORDER — CEFTRIAXONE SODIUM 2 G IJ SOLR
INTRAMUSCULAR | Status: AC
  Filled 2017-11-14: qty 20

## 2017-11-14 MED ORDER — LIDOCAINE HCL 2 % IJ SOLN
INTRAMUSCULAR | Status: AC
  Filled 2017-11-14: qty 20

## 2017-11-14 SURGICAL SUPPLY — 9 items
INST BIOPSY MAXCORE 18GX25 (NEEDLE) ×2 IMPLANT
KIT TURNOVER CYSTO (KITS) ×3 IMPLANT
NDL SAFETY ECLIPSE 18X1.5 (NEEDLE) IMPLANT
NDL SPNL 22GX3.5 QUINCKE BK (NEEDLE) IMPLANT
NEEDLE HYPO 18GX1.5 SHARP (NEEDLE) ×3
NEEDLE SPNL 22GX3.5 QUINCKE BK (NEEDLE) ×3 IMPLANT
SURGILUBE 2OZ TUBE FLIPTOP (MISCELLANEOUS) ×3 IMPLANT
SYR CONTROL 10ML LL (SYRINGE) ×1 IMPLANT
UNDERPAD 30X30 (UNDERPADS AND DIAPERS) ×3 IMPLANT

## 2017-11-14 NOTE — Transfer of Care (Signed)
Immediate Anesthesia Transfer of Care Note  Patient: Patrick Gonzalez  Procedure(s) Performed: TRANSRECTAL ULTRASOUND (N/A Rectum) BIOPSY TRANSRECTAL ULTRASONIC PROSTATE (TUBP) (N/A Prostate)  Patient Location: PACU  Anesthesia Type:MAC  Level of Consciousness: awake, alert  and oriented  Airway & Oxygen Therapy: Patient Spontanous Breathing and Patient connected to nasal cannula oxygen  Post-op Assessment: Report given to RN and Post -op Vital signs reviewed and stable  Post vital signs: Reviewed and stable  Last Vitals:  Vitals Value Taken Time  BP    Temp    Pulse 61 11/14/2017 10:25 AM  Resp    SpO2 100 % 11/14/2017 10:25 AM  Vitals shown include unvalidated device data.  Last Pain:  Vitals:   11/14/17 0851  TempSrc:   PainSc: 0-No pain      Patients Stated Pain Goal: 5 (78/93/81 0175)  Complications: No apparent anesthesia complications

## 2017-11-14 NOTE — Anesthesia Postprocedure Evaluation (Signed)
Anesthesia Post Note  Patient: Patrick Gonzalez  Procedure(s) Performed: TRANSRECTAL ULTRASOUND (N/A Rectum) BIOPSY TRANSRECTAL ULTRASONIC PROSTATE (TUBP) (N/A Prostate)     Patient location during evaluation: PACU Anesthesia Type: General Level of consciousness: awake and alert Pain management: pain level controlled Vital Signs Assessment: post-procedure vital signs reviewed and stable Respiratory status: spontaneous breathing, nonlabored ventilation, respiratory function stable and patient connected to nasal cannula oxygen Cardiovascular status: blood pressure returned to baseline and stable Postop Assessment: no apparent nausea or vomiting Anesthetic complications: no    Last Vitals:  Vitals:   11/14/17 1100 11/14/17 1115  BP: 123/82 121/88  Pulse: 66 66  Resp: 17 18  Temp:    SpO2: 100% 100%    Last Pain:  Vitals:   11/14/17 1145  TempSrc:   PainSc: 0-No pain                 Jaryn Rosko S

## 2017-11-14 NOTE — Anesthesia Preprocedure Evaluation (Addendum)
Anesthesia Evaluation  Patient identified by MRN, date of birth, ID band Patient awake    Reviewed: Allergy & Precautions, NPO status , Patient's Chart, lab work & pertinent test results  Airway Mallampati: II  TM Distance: >3 FB Neck ROM: Full    Dental no notable dental hx.    Pulmonary neg pulmonary ROS,    Pulmonary exam normal breath sounds clear to auscultation       Cardiovascular negative cardio ROS Normal cardiovascular exam Rhythm:Regular Rate:Normal     Neuro/Psych negative neurological ROS  negative psych ROS   GI/Hepatic negative GI ROS, Neg liver ROS,   Endo/Other  negative endocrine ROS  Renal/GU negative Renal ROS  negative genitourinary   Musculoskeletal negative musculoskeletal ROS (+)   Abdominal   Peds negative pediatric ROS (+)  Hematology negative hematology ROS (+)   Anesthesia Other Findings   Reproductive/Obstetrics negative OB ROS                             Anesthesia Physical Anesthesia Plan  ASA: II  Anesthesia Plan: MAC   Post-op Pain Management:    Induction: Intravenous  PONV Risk Score and Plan: 2 and Ondansetron, Dexamethasone and Treatment may vary due to age or medical condition  Airway Management Planned: Simple Face Mask  Additional Equipment:   Intra-op Plan:   Post-operative Plan:   Informed Consent: I have reviewed the patients History and Physical, chart, labs and discussed the procedure including the risks, benefits and alternatives for the proposed anesthesia with the patient or authorized representative who has indicated his/her understanding and acceptance.   Dental advisory given  Plan Discussed with: CRNA and Surgeon  Anesthesia Plan Comments:        Anesthesia Quick Evaluation

## 2017-11-14 NOTE — Discharge Instructions (Signed)
Needle Biopsy, Care After These instructions give you information about caring for yourself after your procedure. Your doctor may also give you more specific instructions. Call your doctor if you have any problems or questions after your procedure. Follow these instructions at home:  Rest as told by your doctor.  Take medicines only as told by your doctor.  There are many different ways to close and cover the biopsy site, including stitches (sutures), skin glue, and adhesive strips. Follow instructions from your doctor about: ? How to take care of your biopsy site. ? When and how you should change your bandage (dressing). ? When you should remove your dressing. ? Removing whatever was used to close your biopsy site.  Check your biopsy site every day for signs of infection. Watch for: ? Redness, swelling, or pain. ? Fluid, blood, or pus. Contact a doctor if:  You have a fever.  You have redness, swelling, or pain at the biopsy site, and it lasts longer than a few days.  You have fluid, blood, or pus coming from the biopsy site.  You feel sick to your stomach (nauseous).  You throw up (vomit).  You are unable to urinate  You have pain unrelieved by your pain medicine Get help right away if:  You are short of breath.  You have trouble breathing.  Your chest hurts.  You feel dizzy or you pass out (faint).  You have bleeding that does not stop with pressure or a bandage.  You cough up blood.  Your belly (abdomen) hurts. This information is not intended to replace advice given to you by your health care provider. Make sure you discuss any questions you have with your health care provider. Document Released: 07/08/2008 Document Revised: 01/01/2016 Document Reviewed: 07/22/2014 Elsevier Interactive Patient Education  2018 [employer]  Post Anesthesia Home Care Instructions  Activity: Get plenty of rest for the remainder of the day. A responsible individual must stay  with you for 24 hours following the procedure.  For the next 24 hours, DO NOT: -Drive a car -Advertising copywriterperate machinery -Drink alcoholic beverages -Take any medication unless instructed by your physician -Make any legal decisions or sign important papers.  Meals: Start with liquid foods such as gelatin or soup. Progress to regular foods as tolerated. Avoid greasy, spicy, heavy foods. If nausea and/or vomiting occur, drink only clear liquids until the nausea and/or vomiting subsides. Call your physician if vomiting continues.  Special Instructions/Symptoms: Your throat may feel dry or sore from the anesthesia or the breathing tube placed in your throat during surgery. If this causes discomfort, gargle with warm salt water. The discomfort should disappear within 24 hours.    I have discussed with the patient the possibility of blood per rectum, per urethra and in the ejaculate. He was counseled to contact me if he has any difficulties following his prostate biopsy whatsoever.

## 2017-11-14 NOTE — Op Note (Signed)
PATIENT:  Patrick Gonzalez  PRE-OPERATIVE DIAGNOSIS: Elevated PSA  POST-OPERATIVE DIAGNOSIS: Same  PROCEDURE: TRUS/BX  SURGEON:  Claybon Jabs  INDICATION: [name] is a 72-year-old male who was found to have an elevated PSA of 12.23 in 2/19.  He was noted on exam to have severe anal stenosis preventing the ability to examine his prostate.  We discussed the need for prostate biopsy based on his elevated PSA and he has elected to proceed.  I am performing this under sedation due to his severe rectal stenosis.  ANESTHESIA:  MAC  EBL:  Minimal  DRAINS: None  LOCAL MEDICATIONS USED: 10 cc of 2% plain lidocaine  SPECIMEN: Prostate needle biopsy specimens to pathology.  Description of procedure: After informed consent the patient was taken to the operating room while on the stretcher was moved to the lateral position with the buttocks at the edge of the table.  He was administered intravenous sedation and an official timeout was then performed.  The transrectal ultrasound probe was then placed in the rectum and using real-time ultrasonography I scanned the prostate from base to apex in the axial and sagittal planes.  I noted some calcification at the junction between the peripheral and transition zones in the base.  The capsule appeared intact throughout.  I noted no definite hypoechoic lesions.  Prostate volume was then measured and found to be 116.6 cc.  2% lidocaine was then placed in the area of the base of the prostate near the seminal vesicles for a standard prostate block.  I then used real-time ultrasonography to perform ultrasound-guided biopsies from 12 equidistant locations using a standard 12 core biopsy template.  This proceeded without complication.  I then removed the transrectal ultrasound probe and performed a digital rectal exam.  I noted the prostate was enlarged but found it to be smooth and free of any definite nodularity or induration.  The patient tolerated the  procedure well with no intraoperative complications.   PLAN OF CARE: Discharge to home after PACU  PATIENT DISPOSITION:  PACU - hemodynamically stable.

## 2017-11-15 ENCOUNTER — Encounter (HOSPITAL_BASED_OUTPATIENT_CLINIC_OR_DEPARTMENT_OTHER): Payer: Self-pay | Admitting: Urology

## 2017-11-16 NOTE — Addendum Note (Signed)
Addendum  created 11/16/17 1000 by Eilene Ghaziose, Anyelo Mccue, MD   Sign clinical note

## 2017-11-16 NOTE — Addendum Note (Signed)
Addendum  created 11/16/17 0959 by Eilene Ghaziose, Rahmah Mccamy, MD   Sign clinical note

## 2017-11-16 NOTE — Anesthesia Postprocedure Evaluation (Signed)
Anesthesia Post Note  Patient: Fabrizio E Sanz  Procedure(s) Performed: TRANSRECTAL ULTRASOUND (N/A Rectum) BIOPSY TRANSRECTAL ULTRASONIC PROSTATE (TUBP) (N/A Prostate)     Patient location during evaluation: PACU Anesthesia Type: MAC Level of consciousness: awake and alert Pain management: pain level controlled Vital Signs Assessment: post-procedure vital signs reviewed and stable Respiratory status: spontaneous breathing, nonlabored ventilation, respiratory function stable and patient connected to nasal cannula oxygen Cardiovascular status: stable and blood pressure returned to baseline Postop Assessment: no apparent nausea or vomiting Anesthetic complications: no    Last Vitals:  Vitals:   11/14/17 1115 11/14/17 1415  BP: 121/88 (!) 153/74  Pulse: 66 95  Resp: 18 14  Temp:  36.6 C  SpO2: 100% 97%    Last Pain:  Vitals:   11/15/17 0958  TempSrc:   PainSc: 5                  Mashal Slavick S

## 2017-11-22 DIAGNOSIS — N4232 Atypical small acinar proliferation of prostate: Secondary | ICD-10-CM | POA: Diagnosis not present

## 2017-11-22 DIAGNOSIS — N401 Enlarged prostate with lower urinary tract symptoms: Secondary | ICD-10-CM | POA: Diagnosis not present

## 2017-11-22 DIAGNOSIS — R972 Elevated prostate specific antigen [PSA]: Secondary | ICD-10-CM | POA: Diagnosis not present

## 2017-12-06 DIAGNOSIS — N453 Epididymo-orchitis: Secondary | ICD-10-CM | POA: Diagnosis not present

## 2017-12-06 DIAGNOSIS — R310 Gross hematuria: Secondary | ICD-10-CM | POA: Diagnosis not present

## 2017-12-06 DIAGNOSIS — N401 Enlarged prostate with lower urinary tract symptoms: Secondary | ICD-10-CM | POA: Diagnosis not present

## 2017-12-06 DIAGNOSIS — N210 Calculus in bladder: Secondary | ICD-10-CM | POA: Diagnosis not present

## 2017-12-06 DIAGNOSIS — R3914 Feeling of incomplete bladder emptying: Secondary | ICD-10-CM | POA: Diagnosis not present

## 2018-01-18 ENCOUNTER — Other Ambulatory Visit: Payer: Self-pay | Admitting: Urology

## 2018-01-20 DIAGNOSIS — R300 Dysuria: Secondary | ICD-10-CM | POA: Diagnosis not present

## 2018-01-20 DIAGNOSIS — N401 Enlarged prostate with lower urinary tract symptoms: Secondary | ICD-10-CM | POA: Diagnosis not present

## 2018-01-20 DIAGNOSIS — N453 Epididymo-orchitis: Secondary | ICD-10-CM | POA: Diagnosis not present

## 2018-01-20 DIAGNOSIS — R3914 Feeling of incomplete bladder emptying: Secondary | ICD-10-CM | POA: Diagnosis not present

## 2018-01-20 DIAGNOSIS — R8279 Other abnormal findings on microbiological examination of urine: Secondary | ICD-10-CM | POA: Diagnosis not present

## 2018-02-10 NOTE — Patient Instructions (Signed)
Patrick Gonzalez  02/10/2018   Your procedure is scheduled on:   Report to [facility] Main  Entrance     Report to Admitting at  9:00 AM    Please consume a Clear Liquid Diet the Day before surgery, per your surgeon's instructions    CLEAR LIQUID DIET   Foods Allowed                                                                     Foods Excluded  Coffee and tea, regular and decaf                             liquids that you cannot  Plain Jell-O in any flavor                                             see through such as: Fruit ices (not with fruit pulp)                                     milk, soups, orange juice  Iced Popsicles                                    All solid food Carbonated beverages, regular and diet                                    Cranberry, grape and apple juices Sports drinks like Gatorade Lightly seasoned clear broth or consume(fat free) Sugar, honey syrup  Sample Menu Breakfast                                Lunch                                     Supper Cranberry juice                    Beef broth                            Chicken broth Jell-O                                     Grape juice                           Apple juice Coffee or tea  Jell-O                                      Popsicle                                                Coffee or tea                        Coffee or tea  _____________________________________________________________________    Call this number if you have problems the morning of surgery [phone]     Remember: Do not eat food or drink liquids :After Midnight.     Take these medicines the morning of surgery with A SIP OF WATER: None              You may not have any metal on your body including hair pins and              piercings  Do not wear jewelry, lotions, powders or deodorant             Men may shave face and neck.   Do not bring valuables to  the hospital. [facility] IS NOT             RESPONSIBLE   FOR VALUABLES.  Contacts, dentures or bridgework may not be worn into surgery.  Leave suitcase in the car. After surgery it may be brought to your room.    Special Instructions: Please follow your prep, per your surgeon's instructions              Please read over the following fact sheets you were given: _____________________________________________________________________          [facility] - Preparing for Surgery Before surgery, you can play an important role.  Because skin is not sterile, your skin needs to be as free of germs as possible.  You can reduce the number of germs on your skin by washing with CHG (chlorahexidine gluconate) soap before surgery.  CHG is an antiseptic cleaner which kills germs and bonds with the skin to continue killing germs even after washing. Please DO NOT use if you have an allergy to CHG or antibacterial soaps.  If your skin becomes reddened/irritated stop using the CHG and inform your nurse when you arrive at Short Stay. Do not shave (including legs and underarms) for at least 48 hours prior to the first CHG shower.  You may shave your face/neck. Please follow these instructions carefully:  1.  Shower with CHG Soap the night before surgery and the  morning of Surgery.  2.  If you choose to wash your hair, wash your hair first as usual with your  normal  shampoo.  3.  After you shampoo, rinse your hair and body thoroughly to remove the  shampoo.                           4.  Use CHG as you would any other liquid soap.  You can apply chg directly  to the skin and wash                       Gently with a scrungie or  clean washcloth.  5.  Apply the CHG Soap to your body ONLY FROM THE NECK DOWN.   Do not use on face/ open                           Wound or open sores. Avoid contact with eyes, ears mouth and genitals (private parts).                       Wash face,  Genitals (private parts) with your  normal soap.             6.  Wash thoroughly, paying special attention to the area where your surgery  will be performed.  7.  Thoroughly rinse your body with warm water from the neck down.  8.  DO NOT shower/wash with your normal soap after using and rinsing off  the CHG Soap.                9.  Pat yourself dry with a clean towel.            10.  Wear clean pajamas.            11.  Place clean sheets on your bed the night of your first shower and do not  sleep with pets. Day of Surgery : Do not apply any lotions/deodorants the morning of surgery.  Please wear clean clothes to the hospital/surgery center.  FAILURE TO FOLLOW THESE INSTRUCTIONS MAY RESULT IN THE CANCELLATION OF YOUR SURGERY PATIENT SIGNATURE_________________________________  NURSE SIGNATURE__________________________________  ________________________________________________________________________

## 2018-02-10 NOTE — Progress Notes (Signed)
07-22-17 (Epic) EKG

## 2018-02-13 ENCOUNTER — Encounter (HOSPITAL_COMMUNITY): Payer: Self-pay

## 2018-02-13 ENCOUNTER — Other Ambulatory Visit: Payer: Self-pay

## 2018-02-13 ENCOUNTER — Encounter (HOSPITAL_COMMUNITY)
Admission: RE | Admit: 2018-02-13 | Discharge: 2018-02-13 | Disposition: A | Discharge status: Home / Self Care | Payer: PPO | Source: Ambulatory Visit | Attending: Urology | Admitting: Urology

## 2018-02-13 DIAGNOSIS — N210 Calculus in bladder: Secondary | ICD-10-CM | POA: Insufficient documentation

## 2018-02-13 DIAGNOSIS — N400 Enlarged prostate without lower urinary tract symptoms: Secondary | ICD-10-CM | POA: Insufficient documentation

## 2018-02-13 DIAGNOSIS — Z01812 Encounter for preprocedural laboratory examination: Secondary | ICD-10-CM | POA: Diagnosis not present

## 2018-02-13 HISTORY — DX: Prediabetes: R73.03

## 2018-02-13 LAB — CBC
HEMATOCRIT: 43.2 % (ref 39.0–52.0)
HEMOGLOBIN: 13.8 g/dL (ref 13.0–17.0)
MCH: 27.5 pg (ref 26.0–34.0)
MCHC: 31.9 g/dL (ref 30.0–36.0)
MCV: 86.2 fL (ref 78.0–100.0)
Platelets: 204 10*3/uL (ref 150–400)
RBC: 5.01 MIL/uL (ref 4.22–5.81)
RDW: 16.5 % — ABNORMAL HIGH (ref 11.5–15.5)
WBC: 4.1 10*3/uL (ref 4.0–10.5)

## 2018-02-13 LAB — COMPREHENSIVE METABOLIC PANEL
ALK PHOS: 65 U/L (ref 38–126)
ALT: 12 U/L (ref 0–44)
AST: 15 U/L (ref 15–41)
Albumin: 3.6 g/dL (ref 3.5–5.0)
Anion gap: 6 (ref 5–15)
BUN: 18 mg/dL (ref 8–23)
CHLORIDE: 108 mmol/L (ref 98–111)
CO2: 27 mmol/L (ref 22–32)
Calcium: 8.8 mg/dL — ABNORMAL LOW (ref 8.9–10.3)
Creatinine, Ser: 0.98 mg/dL (ref 0.61–1.24)
GFR calc Af Amer: >60 mL/min (ref >60)
GFR calc non Af Amer: >60 mL/min (ref >60)
GLUCOSE: 113 mg/dL — AB (ref 70–99)
Potassium: 4.5 mmol/L (ref 3.5–5.1)
SODIUM: 141 mmol/L (ref 135–145)
Total Bilirubin: 0.5 mg/dL (ref 0.3–1.2)
Total Protein: 6.8 g/dL (ref 6.5–8.1)

## 2018-02-13 LAB — HEMOGLOBIN A1C
Hgb A1c MFr Bld: 6.4 % — ABNORMAL HIGH (ref 4.8–5.6)
Mean Plasma Glucose: 136.98 mg/dL

## 2018-02-17 ENCOUNTER — Inpatient Hospital Stay (HOSPITAL_COMMUNITY)
Admission: RE | Admit: 2018-02-17 | Discharge: 2018-02-20 | DRG: 717 | Disposition: A | Discharge status: Home / Self Care | Payer: PPO | Attending: Urology | Admitting: Urology

## 2018-02-17 ENCOUNTER — Inpatient Hospital Stay (HOSPITAL_COMMUNITY): Payer: PPO | Admitting: Certified Registered Nurse Anesthetist

## 2018-02-17 ENCOUNTER — Other Ambulatory Visit: Payer: Self-pay

## 2018-02-17 ENCOUNTER — Encounter (HOSPITAL_COMMUNITY)
Admission: RE | Disposition: A | Discharge status: Home / Self Care | Payer: Self-pay | Source: Home / Self Care | Attending: Urology

## 2018-02-17 ENCOUNTER — Encounter (HOSPITAL_COMMUNITY): Payer: Self-pay | Admitting: Emergency Medicine

## 2018-02-17 DIAGNOSIS — N400 Enlarged prostate without lower urinary tract symptoms: Secondary | ICD-10-CM | POA: Diagnosis not present

## 2018-02-17 DIAGNOSIS — N210 Calculus in bladder: Secondary | ICD-10-CM | POA: Diagnosis present

## 2018-02-17 DIAGNOSIS — N401 Enlarged prostate with lower urinary tract symptoms: Principal | ICD-10-CM | POA: Diagnosis present

## 2018-02-17 DIAGNOSIS — R310 Gross hematuria: Secondary | ICD-10-CM | POA: Diagnosis not present

## 2018-02-17 DIAGNOSIS — R338 Other retention of urine: Secondary | ICD-10-CM | POA: Diagnosis not present

## 2018-02-17 DIAGNOSIS — N138 Other obstructive and reflux uropathy: Secondary | ICD-10-CM | POA: Diagnosis present

## 2018-02-17 DIAGNOSIS — R7303 Prediabetes: Secondary | ICD-10-CM | POA: Diagnosis not present

## 2018-02-17 HISTORY — PX: PROSTATECTOMY: SHX69

## 2018-02-17 LAB — TYPE AND SCREEN
ABO/RH(D): A POS
Antibody Screen: NEGATIVE

## 2018-02-17 LAB — HEMOGLOBIN AND HEMATOCRIT, BLOOD
HCT: 39.5 % (ref 39.0–52.0)
Hemoglobin: 12.4 g/dL — ABNORMAL LOW (ref 13.0–17.0)

## 2018-02-17 LAB — ABO/RH: ABO/RH(D): A POS

## 2018-02-17 SURGERY — PROSTATECTOMY
Anesthesia: General

## 2018-02-17 MED ORDER — SODIUM CHLORIDE 0.9 % IR SOLN
Status: DC | PRN
  Administered 2018-02-17: 3000 mL via INTRAVESICAL

## 2018-02-17 MED ORDER — MIDAZOLAM HCL 5 MG/5ML IJ SOLN
INTRAMUSCULAR | Status: DC | PRN
  Administered 2018-02-17 (×2): 1 mg via INTRAVENOUS

## 2018-02-17 MED ORDER — ALBUMIN HUMAN 5 % IV SOLN
INTRAVENOUS | Status: DC | PRN
  Administered 2018-02-17: 13:00:00 via INTRAVENOUS

## 2018-02-17 MED ORDER — HYDROMORPHONE HCL 1 MG/ML IJ SOLN
0.2500 mg | INTRAMUSCULAR | Status: DC | PRN
  Administered 2018-02-17 (×2): 0.5 mg via INTRAVENOUS

## 2018-02-17 MED ORDER — STERILE WATER FOR IRRIGATION IR SOLN
Status: DC | PRN
  Administered 2018-02-17: 500 mL

## 2018-02-17 MED ORDER — MAGNESIUM CITRATE PO SOLN
1.0000 | Freq: Once | ORAL | Status: DC
  Filled 2018-02-17: qty 296

## 2018-02-17 MED ORDER — SODIUM CHLORIDE 0.9 % IR SOLN
Status: DC | PRN
  Administered 2018-02-17: 2000 mL

## 2018-02-17 MED ORDER — INDIGOTINDISULFONATE SODIUM 8 MG/ML IJ SOLN
INTRAMUSCULAR | Status: DC | PRN
  Administered 2018-02-17: 5 mL via INTRAVENOUS

## 2018-02-17 MED ORDER — HYDROMORPHONE HCL 1 MG/ML IJ SOLN
0.5000 mg | INTRAMUSCULAR | Status: DC | PRN
  Administered 2018-02-17 (×2): 1 mg via INTRAVENOUS
  Administered 2018-02-18 (×2): 0.5 mg via INTRAVENOUS
  Administered 2018-02-19: 1 mg via INTRAVENOUS
  Filled 2018-02-17 (×5): qty 1

## 2018-02-17 MED ORDER — OXYCODONE HCL 5 MG PO TABS
5.0000 mg | ORAL_TABLET | ORAL | Status: DC | PRN
  Administered 2018-02-19 – 2018-02-20 (×5): 5 mg via ORAL
  Filled 2018-02-17 (×5): qty 1

## 2018-02-17 MED ORDER — ROCURONIUM BROMIDE 100 MG/10ML IV SOLN
INTRAVENOUS | Status: DC | PRN
  Administered 2018-02-17: 20 mg via INTRAVENOUS
  Administered 2018-02-17: 50 mg via INTRAVENOUS

## 2018-02-17 MED ORDER — ACETAMINOPHEN 10 MG/ML IV SOLN
INTRAVENOUS | Status: AC
  Filled 2018-02-17: qty 100

## 2018-02-17 MED ORDER — HYDROMORPHONE HCL 1 MG/ML IJ SOLN
INTRAMUSCULAR | Status: AC
  Filled 2018-02-17: qty 1

## 2018-02-17 MED ORDER — SODIUM CHLORIDE 0.9 % IV SOLN
2.0000 g | INTRAVENOUS | Status: AC
  Administered 2018-02-17: 2 g via INTRAVENOUS
  Filled 2018-02-17: qty 20

## 2018-02-17 MED ORDER — BUPIVACAINE LIPOSOME 1.3 % IJ SUSP
20.0000 mL | Freq: Once | INTRAMUSCULAR | Status: AC
  Administered 2018-02-17: 20 mL
  Filled 2018-02-17: qty 20

## 2018-02-17 MED ORDER — ONDANSETRON HCL 4 MG/2ML IJ SOLN: 4.0000 mg | INTRAMUSCULAR | Status: DC | PRN

## 2018-02-17 MED ORDER — FENTANYL CITRATE (PF) 100 MCG/2ML IJ SOLN
INTRAMUSCULAR | Status: DC | PRN
  Administered 2018-02-17: 100 ug via INTRAVENOUS
  Administered 2018-02-17 (×3): 50 ug via INTRAVENOUS

## 2018-02-17 MED ORDER — SODIUM CHLORIDE 0.9 % IJ SOLN
INTRAMUSCULAR | Status: DC | PRN
  Administered 2018-02-17: 10 mL

## 2018-02-17 MED ORDER — PROPOFOL 10 MG/ML IV BOLUS
INTRAVENOUS | Status: AC
  Filled 2018-02-17: qty 20

## 2018-02-17 MED ORDER — SODIUM CHLORIDE 0.9 % IV SOLN
INTRAVENOUS | Status: DC | PRN
  Administered 2018-02-17: 40 ug/min via INTRAVENOUS

## 2018-02-17 MED ORDER — DEXTROSE-NACL 5-0.45 % IV SOLN
INTRAVENOUS | Status: DC
  Administered 2018-02-18: 05:00:00 via INTRAVENOUS

## 2018-02-17 MED ORDER — OXYCODONE HCL 5 MG PO TABS: 5.0000 mg | ORAL_TABLET | Freq: Once | ORAL | Status: DC | PRN

## 2018-02-17 MED ORDER — DIPHENHYDRAMINE HCL 12.5 MG/5ML PO ELIX: 12.5000 mg | ORAL_SOLUTION | Freq: Four times a day (QID) | ORAL | Status: DC | PRN

## 2018-02-17 MED ORDER — FENTANYL CITRATE (PF) 250 MCG/5ML IJ SOLN
INTRAMUSCULAR | Status: AC
  Filled 2018-02-17: qty 5

## 2018-02-17 MED ORDER — SUGAMMADEX SODIUM 500 MG/5ML IV SOLN
INTRAVENOUS | Status: DC | PRN
  Administered 2018-02-17: 100 mg via INTRAVENOUS
  Administered 2018-02-17: 200 mg via INTRAVENOUS

## 2018-02-17 MED ORDER — HYDROCODONE-ACETAMINOPHEN 5-325 MG PO TABS: 1.0000 | ORAL_TABLET | Freq: Four times a day (QID) | ORAL | 0 refills | Status: AC | PRN

## 2018-02-17 MED ORDER — ONDANSETRON HCL 4 MG/2ML IJ SOLN
INTRAMUSCULAR | Status: DC | PRN
  Administered 2018-02-17: 4 mg via INTRAVENOUS

## 2018-02-17 MED ORDER — ACETAMINOPHEN 325 MG PO TABS
650.0000 mg | ORAL_TABLET | ORAL | Status: DC | PRN
  Administered 2018-02-20: 650 mg via ORAL
  Filled 2018-02-17: qty 2

## 2018-02-17 MED ORDER — ACETAMINOPHEN 10 MG/ML IV SOLN
INTRAVENOUS | Status: DC | PRN
  Administered 2018-02-17: 1000 mg via INTRAVENOUS

## 2018-02-17 MED ORDER — INDIGOTINDISULFONATE SODIUM 8 MG/ML IJ SOLN
INTRAMUSCULAR | Status: AC
  Filled 2018-02-17: qty 5

## 2018-02-17 MED ORDER — DIPHENHYDRAMINE HCL 50 MG/ML IJ SOLN: 12.5000 mg | Freq: Four times a day (QID) | INTRAMUSCULAR | Status: DC | PRN

## 2018-02-17 MED ORDER — LIDOCAINE HCL (CARDIAC) PF 100 MG/5ML IV SOSY
PREFILLED_SYRINGE | INTRAVENOUS | Status: DC | PRN
  Administered 2018-02-17: 40 mg via INTRAVENOUS

## 2018-02-17 MED ORDER — PROPOFOL 10 MG/ML IV BOLUS
INTRAVENOUS | Status: DC | PRN
  Administered 2018-02-17: 125 mg via INTRAVENOUS
  Administered 2018-02-17: 35 mg via INTRAVENOUS
  Administered 2018-02-17: 40 mg via INTRAVENOUS

## 2018-02-17 MED ORDER — LACTATED RINGERS IV SOLN
INTRAVENOUS | Status: DC
  Administered 2018-02-17 (×3): via INTRAVENOUS

## 2018-02-17 MED ORDER — MIDAZOLAM HCL 2 MG/2ML IJ SOLN
INTRAMUSCULAR | Status: AC
  Filled 2018-02-17: qty 2

## 2018-02-17 MED ORDER — OXYCODONE HCL 5 MG/5ML PO SOLN
5.0000 mg | Freq: Once | ORAL | Status: DC | PRN
  Filled 2018-02-17: qty 5

## 2018-02-17 MED ORDER — LACTATED RINGERS IV SOLN
INTRAVENOUS | Status: DC | PRN
  Administered 2018-02-17: 11:00:00 via INTRAVENOUS

## 2018-02-17 MED ORDER — SODIUM CHLORIDE 0.9 % IJ SOLN
INTRAMUSCULAR | Status: AC
  Filled 2018-02-17: qty 20

## 2018-02-17 SURGICAL SUPPLY — 46 items
ADH SKN CLS APL DERMABOND .7 (GAUZE/BANDAGES/DRESSINGS) ×1
BAG URINE DRAINAGE (UROLOGICAL SUPPLIES) ×2 IMPLANT
CATH FOLEY 3WAY 30CC 22FR (CATHETERS) ×2 IMPLANT
CLOTH BEACON ORANGE TIMEOUT ST (SAFETY) ×3 IMPLANT
COVER SURGICAL LIGHT HANDLE (MISCELLANEOUS) ×3 IMPLANT
DERMABOND ADVANCED (GAUZE/BANDAGES/DRESSINGS) ×2
DERMABOND ADVANCED .7 DNX12 (GAUZE/BANDAGES/DRESSINGS) IMPLANT
DRAIN CHANNEL 19F RND (DRAIN) ×2 IMPLANT
DRAPE LAPAROSCOPIC ABDOMINAL (DRAPES) ×2 IMPLANT
DRAPE WARM FLUID 44X44 (DRAPE) ×2 IMPLANT
DRSG TEGADERM 4X4.75 (GAUZE/BANDAGES/DRESSINGS) ×2 IMPLANT
DRSG TEGADERM 8X12 (GAUZE/BANDAGES/DRESSINGS) ×2 IMPLANT
ELECT BLADE 6.5 .24CM SHAFT (ELECTRODE) ×2 IMPLANT
EVACUATOR SILICONE 100CC (DRAIN) ×2 IMPLANT
GAUZE SPONGE 2X2 8PLY STRL LF (GAUZE/BANDAGES/DRESSINGS) IMPLANT
GLOVE BIO SURGEON STRL SZ 6.5 (GLOVE) ×2 IMPLANT
GLOVE BIO SURGEONS STRL SZ 6.5 (GLOVE) ×2
GLOVE BIOGEL M 8.0 STRL (GLOVE) ×3 IMPLANT
GLOVE BIOGEL M STRL SZ7.5 (GLOVE) ×2 IMPLANT
GLOVE BIOGEL PI IND STRL 7.0 (GLOVE) IMPLANT
GLOVE BIOGEL PI IND STRL 8 (GLOVE) IMPLANT
GLOVE BIOGEL PI INDICATOR 7.0 (GLOVE) ×8
GLOVE BIOGEL PI INDICATOR 8 (GLOVE) ×2
GOWN STRL REUS W/TWL LRG LVL3 (GOWN DISPOSABLE) ×9 IMPLANT
GOWN STRL REUS W/TWL XL LVL3 (GOWN DISPOSABLE) ×4 IMPLANT
KIT BASIN OR (CUSTOM PROCEDURE TRAY) ×2 IMPLANT
MANIFOLD NEPTUNE II (INSTRUMENTS) ×3 IMPLANT
NDL SPNL 22GX3.5 QUINCKE BK (NEEDLE) IMPLANT
NEEDLE HYPO 22GX1.5 SAFETY (NEEDLE) ×2 IMPLANT
NEEDLE SPNL 22GX3.5 QUINCKE BK (NEEDLE) ×3 IMPLANT
PACK GENERAL/GYN (CUSTOM PROCEDURE TRAY) ×3 IMPLANT
PLUG CATH AND CAP STER (CATHETERS) ×2 IMPLANT
SET IRRIG Y TYPE TUR BLADDER L (SET/KITS/TRAYS/PACK) ×2 IMPLANT
SPONGE GAUZE 2X2 STER 10/PKG (GAUZE/BANDAGES/DRESSINGS) ×2
SUT ETHILON 3 0 PS 1 (SUTURE) ×2 IMPLANT
SUT MNCRL AB 4-0 PS2 18 (SUTURE) ×3 IMPLANT
SUT PDS AB 1 TP1 96 (SUTURE) ×2 IMPLANT
SUT SILK 0 CT 1 30 (SUTURE) ×2 IMPLANT
SUT VIC AB 0 CT1 27 (SUTURE) ×6
SUT VIC AB 0 CT1 27XBRD ANTBC (SUTURE) IMPLANT
SUT VIC AB 2-0 UR5 27 (SUTURE) ×4 IMPLANT
SUT VIC AB 2-0 UR6 27 (SUTURE) ×3 IMPLANT
SYRINGE 35CC LL (MISCELLANEOUS) ×2 IMPLANT
SYRINGE IRR TOOMEY STRL 70CC (SYRINGE) ×2 IMPLANT
TOWEL NATURAL 10PK STERILE (DISPOSABLE) ×2 IMPLANT
TOWEL OR NON WOVEN STRL DISP B (DISPOSABLE) ×2 IMPLANT

## 2018-02-17 NOTE — Anesthesia Procedure Notes (Signed)
Procedure Name: Intubation Date/Time: 02/17/2018 11:32 AM Performed by: Ofilia Neas, CRNA Pre-anesthesia Checklist: Patient identified, Emergency Drugs available, Suction available, Patient being monitored and Timeout performed Patient Re-evaluated:Patient Re-evaluated prior to induction Oxygen Delivery Method: Circle system utilized Preoxygenation: Pre-oxygenation with 100% oxygen Induction Type: IV induction Ventilation: Mask ventilation without difficulty Laryngoscope Size: Mac and 4 Grade View: Grade I Tube type: Oral Tube size: 7.0 mm Number of attempts: 1 Airway Equipment and Method: Stylet Placement Confirmation: ETT inserted through vocal cords under direct vision,  positive ETCO2 and breath sounds checked- equal and bilateral Secured at: 21 cm Tube secured with: Tape Dental Injury: Teeth and Oropharynx as per pre-operative assessment  Comments: Smaller ETT  Electively due to hx trache.  Passed easily, grade 1 view.

## 2018-02-17 NOTE — Anesthesia Preprocedure Evaluation (Signed)
Anesthesia Evaluation  Patient identified by MRN, date of birth, ID band Patient awake    Reviewed: Allergy & Precautions, NPO status , Patient's Chart, lab work & pertinent test results  History of Anesthesia Complications Negative for: history of anesthetic complications  Airway Mallampati: II  TM Distance: >3 FB Neck ROM: Full    Dental  (+) Edentulous Upper, Edentulous Lower   Pulmonary neg pulmonary ROS,    breath sounds clear to auscultation       Cardiovascular negative cardio ROS Normal cardiovascular exam Rhythm:Regular Rate:Normal     Neuro/Psych negative neurological ROS  negative psych ROS   GI/Hepatic negative GI ROS, Neg liver ROS,   Endo/Other  negative endocrine ROS  Renal/GU negative Renal ROS     Musculoskeletal  (+) Arthritis ,   Abdominal   Peds  Hematology negative hematology ROS (+)   Anesthesia Other Findings   Reproductive/Obstetrics                             Anesthesia Physical Anesthesia Plan  ASA: II  Anesthesia Plan: General   Post-op Pain Management:    Induction: Intravenous  PONV Risk Score and Plan: 2 and Ondansetron and Dexamethasone  Airway Management Planned: Oral ETT  Additional Equipment: None  Intra-op Plan:   Post-operative Plan: Extubation in OR  Informed Consent: I have reviewed the patients History and Physical, chart, labs and discussed the procedure including the risks, benefits and alternatives for the proposed anesthesia with the patient or authorized representative who has indicated his/her understanding and acceptance.   Dental advisory given  Plan Discussed with: CRNA and Surgeon  Anesthesia Plan Comments:         Anesthesia Quick Evaluation

## 2018-02-17 NOTE — Transfer of Care (Signed)
Immediate Anesthesia Transfer of Care Note  Patient: Patrick Gonzalez  Procedure(s) Performed: OPEN SIMPLE PROSTATECTOMY; CYSTOLITHOTOMY (N/A )  Patient Location: PACU  Anesthesia Type:General  Level of Consciousness: awake, oriented, drowsy, patient cooperative and responds to stimulation  Airway & Oxygen Therapy: Patient Spontanous Breathing and Patient connected to face mask oxygen  Post-op Assessment: Report given to RN, Post -op Vital signs reviewed and stable and Patient moving all extremities  Post vital signs: Reviewed and stable  Last Vitals:  Vitals Value Taken Time  BP 132/88 02/17/2018  2:15 PM  Temp    Pulse 62 02/17/2018  2:20 PM  Resp 9 02/17/2018  2:20 PM  SpO2 100 % 02/17/2018  2:20 PM  Vitals shown include unvalidated device data.  Last Pain:  Vitals:   02/17/18 0921  TempSrc:   PainSc: 0-No pain      Patients Stated Pain Goal: 4 (02/17/18 [id number])  Complications: No apparent anesthesia complications
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
## 2018-02-17 NOTE — Discharge Instructions (Signed)

## 2018-02-17 NOTE — Op Note (Signed)
PREOPERATIVE DIAGNOSIS: BPH with urinary retention, bladder calculi  POSTOPERATIVE DIAGNOSIS: Same  PROCEDURES: 1. Open simple prostatectomy. 2. Cystolithotomy   ANESTHESIA: General  ATTENDING: Wilkie AyePatrick Yoselyn Mcglade, MD  ASSISTANT: Flo ShanksAmanda Dancey, PA  RESIDENT: Case Lucretia RoersWood, MD  ESTIMATED BLOOD LOSS: 500 mL.  COMPLICATIONS: None.  SPECIMEN: 1.prostatic adenoma 2. Bladder calculi  ANTIBIOTICS: rocephin  FINDINGS: 3cm intravesical prostatic protrusion. 8 1.5 bladder calculi Ureteral orifices in normal anatomic location. No leaks from cystotomy at 150cc of water.  The assistant was utilized for retraction, suction, and passing suture.  DRAINS: 1. Jackson-Pratt drain to bulb suction. 2. Foley catheter to CBI  INDICATION: [name] is a very pleasant 72 year old gentleman, who has BPH with significant LUTS including elevated PVR and bladder calculi. His TRUS volume is 150cc.  Options were discussed with the patient in detail for primary manage including continued surveillance protocols versus surgical extirpation with and without minimally invasive assistance and he wished to proceed with open simple prostatectomy and bladder calculi removal. Informed consent was obtained and placed in the medical record.  PROCEDURE IN DETAIL: The patient was brought to the operating room and a breif timeout was down to ensure correct patient, correct procedure, and correct site. Intravenous antibiotics were administered. General endotracheal anesthesia was introduced. The patient was placed into a supine position after tucking his arms with foam padding, placing on a pink and non-slide foam pad.  Sterile field was created by prepping and draping the patient's penis, perineum and proximal thighs using iodine and his infra-xiphoid abdomen using chlorhexidine gluconate.  We then made a midline incision from the pubic bone to the umbilicus. We split the rectum muscle and then entered the space  of Retzius. We then entered the peritoneal cavity and the small bowel was packed superiorly. Next, the bladder neck was identified moving the Foley catheter back and forth. We then made a 6cm transverse cystotomy 3cm from the bladder neck. We identified the ureteral orifices and care was taken to exclude then from the dissection. We then placed 2 holding stitches in the anterior bladder wall and secured it to Coopers ligament.  We then made a circumscribing incision around the base of the prostate. Using blunt diseection between the prostate capsule and the adenoma the adenoma was removed. We then placed 2-0 vicryl sutures at the 5 and 7 oclock position to obtain hemostasis.  We then tacked down the bladder neck to the prostatic fossa with a single interrupted 2-0 vicryl. We placed a new 22 french foley into the bladder via the urethra. We then proceeded to closed the cystotomy. We closed the bladder with a running 0 vicryl full thickness. We then performed a imbricating second layer  Closure with 0 vicryl. The bladder was then filled with 150cc of water and we noted no leak.  All sponge and needle counts were correct. A closed suction drain was brought through a separate incision in the right lower quadrant. The then closed the fascia with loops 0 PDS in a running fashion. The scarpa's layer was closed with 3-0 micryl in a running fashion. The skin was then closed with 4-0 monocryl in a running fashion. Dermabond was placed over the incision. The incision was infiltrated with dilute Lyophilized Marcaine. Procedure was then terminated. The patient tolerated the procedure well. There were no immediate periprocedural complications and the patient was taken to the postanesthesia care unit in stable Condition.  COMPLICATIONS: None  CONDITION: Stable, extubated, transferred to PACU  PLAN: The patient will be admitted  for 1-2 for hydration, post operative monitoring and pain control. He will be  discharged home with foley in place and foley will be removed in 14 days, He will have a cystogram prior to foley catheter removal

## 2018-02-18 LAB — BASIC METABOLIC PANEL
ANION GAP: 6 (ref 5–15)
BUN: 26 mg/dL — ABNORMAL HIGH (ref 8–23)
CALCIUM: 8.3 mg/dL — AB (ref 8.9–10.3)
CO2: 28 mmol/L (ref 22–32)
Chloride: 105 mmol/L (ref 98–111)
Creatinine, Ser: 1.46 mg/dL — ABNORMAL HIGH (ref 0.61–1.24)
GFR calc Af Amer: 55 mL/min — ABNORMAL LOW (ref >60)
GFR, EST NON AFRICAN AMERICAN: 47 mL/min — AB (ref >60)
Glucose, Bld: 165 mg/dL — ABNORMAL HIGH (ref 70–99)
POTASSIUM: 4.9 mmol/L (ref 3.5–5.1)
Sodium: 139 mmol/L (ref 135–145)

## 2018-02-18 LAB — HEMOGLOBIN AND HEMATOCRIT, BLOOD
HEMATOCRIT: 36.4 % — AB (ref 39.0–52.0)
Hemoglobin: 11.4 g/dL — ABNORMAL LOW (ref 13.0–17.0)

## 2018-02-18 NOTE — Progress Notes (Signed)
1 Day Post-Op   Subjective/Chief Complaint:  1 - Massive Prostatic Hypertrophy with Bladder Stones and Urinary Retention - s/p open simple prostatectomy with cystolitholapaxy  02/17/18. Path pending. 7/13 Hgb 11.4, Cr 1.4. JP <25600mL.  Today "Robin" is stable. Ambulated last PM. No fevers / emesis. Pain controlled Remains on bladder irrigation as expected.     Objective: Vital signs in last 24 hours: Temp:  [97.4 F (36.3 C)-98.1 F (36.7 C)] 98.1 F (36.7 C) (07/13 0500) Pulse Rate:  [54-90] 79 (07/13 0500) Resp:  [8-18] 12 (07/13 0500) BP: (124-159)/(84-115) 135/86 (07/13 0500) SpO2:  [73 %-100 %] 73 % (07/13 0500) Weight:  [68.9 kg (152 lb)-69.9 kg (154 lb)] 68.9 kg (152 lb) (07/12 1545) Last BM Date: 02/16/18  Intake/Output from previous day: 07/12 0701 - 07/13 0700 In: [id number] [I.V.:2290; IV Piggyback:250] Out: [id number] [Urine:22515; Drains:180; Blood:500] Intake/Output this shift: Total I/O In: -  Out: 2230 [Urine:2200; Drains:30]  General appearance: alert, cooperative, appears stated age and very pleasant Eyes: negative Nose: Nares normal. Septum midline. Mucosa normal. No drainage or sinus tenderness. Throat: lips, mucosa, and tongue normal; teeth and gums normal Neck: supple, symmetrical, trachea midline Back: symmetric, no curvature. ROM normal. No CVA tenderness. Resp: non-labored on minmial [place] O2 Cardio: Nl rate GI: soft, non-tender; bowel sounds normal; no masses,  no organomegaly Male genitalia: normal, 3 way foely in place on NS irrigation and gentle traction, efflux very llight red, no clots.  Extremities: extremities normal, atraumatic, no cyanosis or edema Skin: Skin color, texture, turgor normal. No rashes or lesions Lymph nodes: Cervical, supraclavicular, and axillary nodes normal. Neurologic: Grossly normal Incision/Wound: recent incision c/d/i. JP with minimal serosanguinous fluid that is non-foul.   Lab Results:  Recent Labs    02/17/18 1436  02/18/18 0420  HGB 12.4* 11.4*  HCT 39.5 36.4*   BMET Recent Labs    02/18/18 0420  NA 139  K 4.9  CL 105  CO2 28  GLUCOSE 165*  BUN 26*  CREATININE 1.46*  CALCIUM 8.3*   PT/INR No results for input(s): LABPROT, INR in the last 72 hours. ABG No results for input(s): PHART, HCO3 in the last 72 hours.  Invalid input(s): PCO2, PO2  Studies/Results: No results found.  Anti-infectives: Anti-infectives (From admission, onward)   Start     Dose/Rate Route Frequency Ordered Stop   02/17/18 0901  cefTRIAXone (ROCEPHIN) 2 g in sodium chloride 0.9 % 100 mL IVPB     2 g 200 mL/hr over 30 Minutes Intravenous 30 min pre-op 02/17/18 0901 02/17/18 1124      Assessment/Plan:  1 - Massive Prostatic Hypertrophy with Bladder Stones and Urinary Retention - doing well POD 1. Saline lock, adv to carb-mod diet. Continue foley on traction / irrigation for now. Pt updated on plan. Remain in house.   [facility], Malyiah Fellows 02/18/2018

## 2018-02-18 NOTE — Progress Notes (Signed)
Patient had bladder pressure and had to be hand irrigated twice overnight. Once shortly after 3:00am and again this morning at 07:30am. Patient is aware of when he needs to be hand irrigated and was encouraged to let nursing staff know if the feeling comes back. Will continue to monitor patient.

## 2018-02-19 NOTE — Progress Notes (Signed)
2 Days Post-Op   Subjective/Chief Complaint:  1 - Massive Prostatic Hypertrophy with Bladder Stones and Urinary Retention - s/p open simple prostatectomy with cystolitholapaxy  02/17/18. Path pending. 7/13 Hgb 11.4, Cr 1.4. JP <24300mL. 7/13 JP removed as output scant (<16100mL in 24 hr).  Today "Patrick Gonzalez" is stable.  Urine clearing well now off irrigation. Tollerating PO and ambulating. Required hand irrigation x 1 yesterday late afternoon for small clot only.    Objective: Vital signs in last 24 hours: Temp:  [97.5 F (36.4 C)-99.1 F (37.3 C)] 97.5 F (36.4 C) (07/14 0405) Pulse Rate:  [86-95] 87 (07/14 0405) Resp:  [15-18] 18 (07/14 0405) BP: (130-144)/(83-90) 143/85 (07/14 0405) SpO2:  [96 %-100 %] 99 % (07/14 0405) Last BM Date: 02/16/18  Intake/Output from previous day: 07/13 0701 - 07/14 0700 In: 4060 [P.O.:360] Out: [id number] [UEAVW:09811[Urine:14825; Drains:50] Intake/Output this shift: No intake/output data recorded.  General appearance: alert, cooperative, appears stated age and very pleasant Eyes: negative Nose: Nares normal. Septum midline. Mucosa normal. No drainage or sinus tenderness. Throat: lips, mucosa, and tongue normal; teeth and gums normal Neck: supple, symmetrical, trachea midline Back: symmetric, no curvature. ROM normal. No CVA tenderness. Resp: non-labored on minmial [place] O2 Cardio: Nl rate GI: soft, non-tender; bowel sounds normal; no masses,  no organomegaly Male genitalia: normal, 3 way foely in place off NS irrigation and gentle traction, efflux very lmedium yellow, no clots. Extremities: extremities normal, atraumatic, no cyanosis or edema Skin: Skin color, texture, turgor normal. No rashes or lesions Lymph nodes: Cervical, supraclavicular, and axillary nodes normal. Neurologic: Grossly normal Incision/Wound: recent incision c/d/i. JP with minimal serosanguinous fluid that is non-foul, removed and dry dressing applied.   Lab Results:  Recent Labs    02/17/18 1436  02/18/18 0420  HGB 12.4* 11.4*  HCT 39.5 36.4*   BMET Recent Labs    02/18/18 0420  NA 139  K 4.9  CL 105  CO2 28  GLUCOSE 165*  BUN 26*  CREATININE 1.46*  CALCIUM 8.3*   PT/INR No results for input(s): LABPROT, INR in the last 72 hours. ABG No results for input(s): PHART, HCO3 in the last 72 hours.  Invalid input(s): PCO2, PO2  Studies/Results: No results found.  Anti-infectives: Anti-infectives (From admission, onward)   Start     Dose/Rate Route Frequency Ordered Stop   02/17/18 0901  cefTRIAXone (ROCEPHIN) 2 g in sodium chloride 0.9 % 100 mL IVPB     2 g 200 mL/hr over 30 Minutes Intravenous 30 min pre-op 02/17/18 0901 02/17/18 1124      Assessment/Plan:   1 - Massive Prostatic Hypertrophy with Bladder Stones and Urinary Retention - doing well POD 2. JP out today. COntinue foley off irrigation and PRN hand irrigation only. Likely DC tomorrow AM based on current progress.  [facility] Center[facility], Adama Ferber 02/19/2018

## 2018-02-20 ENCOUNTER — Encounter (HOSPITAL_COMMUNITY): Payer: Self-pay | Admitting: Urology

## 2018-02-20 NOTE — Discharge Summary (Signed)
[facility] Discharge Summary  Admit date: 02/17/2018  Discharge date and time: 02/20/18   Discharge to: Home  Discharge Service: Urology  Discharge Attending Physician:  Wilkie AyePatrick McKenzie, MD  Discharge  Diagnoses: BPH  Secondary Diagnosis: Active Problems:   BPH with obstruction/lower urinary tract symptoms   OR Procedures: Procedure(s): OPEN SIMPLE PROSTATECTOMY; CYSTOLITHOTOMY 02/17/2018   Ancillary Procedures: None   Discharge Day Services: The patient was seen and examined by the Urology team both in the morning and immediately prior to discharge.  Vital signs and laboratory values were stable and within normal limits.  The physical exam was benign and unchanged and all surgical wounds were examined.  Discharge instructions were explained and all questions answered.  Subjective  No acute events overnight. Pain Controlled. No fever or chills.  Objective Patient Vitals for the past 8 hrs:  BP Temp Temp src Pulse Resp  02/20/18 1438 118/64 98.9 F (37.2 C) Oral 87 18   Total I/O In: 600 [P.O.:600] Out: 525 [Urine:525]  General Appearance:        No acute distress Lungs:                       Normal work of breathing on room air Heart:                                Regular rate and rhythm Abdomen:                         Soft, non-tender, non-distended, incision c/d/i GU:        3-way Foley catheter draining thin watermelon color urine with CBI clamped Extremities:                      Warm and well perfused   Hospital Course:  The patient underwent open simple prostatectomy on 02/17/2018.  The patient tolerated the procedure well, was extubated in the OR, and afterwards was taken to the PACU for routine post-surgical care. When stable the patient was transferred to the floor.  The patient did well postoperatively. His drain was removed POD#2. The patient's diet was slowly advanced and at the time of discharge was tolerating a regular diet.  The patient was  discharged home 3 Days Post-Op, at which point was tolerating a regular solid diet, was making adequate urine via Foley catheter, having adequate pain control with P.O. pain medication, and could ambulate without difficulty. The patient will follow up with us for post op check and TOV with cystogram prior.  Condition at Discharge: Improved  Discharge Medications:  Allergies as of 02/20/2018   No Known Allergies     Medication List    STOP taking these medications   ibuprofen 200 MG tablet Commonly known as:  ADVIL,MOTRIN     TAKE these medications   acetaminophen 500 MG tablet Commonly known as:  TYLENOL Take 500 mg by mouth every 6 (six) hours as needed for mild pain or moderate pain.   HYDROcodone-acetaminophen 5-325 MG tablet Commonly known as:  NORCO Take 1-2 tablets by mouth every 6 (six) hours as needed for moderate pain or severe pain.   sulfamethoxazole-trimethoprim 800-160 MG tablet Commonly known as:  BACTRIM DS,SEPTRA DS Take 1 tablet by mouth 2 (two) times daily.

## 2018-02-20 NOTE — Care Management Important Message (Signed)
Important Message  Patient Details  Name: Patrick Gonzalez Pickerel MRN: [id number] Date of Birth: 02/18/1946   Medicare Important Message Given:  Yes    Caren MacadamFuller, Dacotah Cabello 02/20/2018, 12:26 PMImportant Message  Patient Details  Name: Patrick Gonzalez Boyajian MRN: [id number] Date of Birth: 04/11/1946   Medicare Important Message Given:  Yes    Caren MacadamFuller, Quantay Zaremba 02/20/2018, 12:26 PM

## 2018-02-20 NOTE — Anesthesia Postprocedure Evaluation (Signed)
Anesthesia Post Note  Patient: Patrick Gonzalez  Procedure(s) Performed: OPEN SIMPLE PROSTATECTOMY; CYSTOLITHOTOMY (N/A )     Patient location during evaluation: PACU Anesthesia Type: General Level of consciousness: awake and alert Pain management: pain level controlled Vital Signs Assessment: post-procedure vital signs reviewed and stable Respiratory status: spontaneous breathing, nonlabored ventilation, respiratory function stable and patient connected to nasal cannula oxygen Cardiovascular status: blood pressure returned to baseline and stable Postop Assessment: no apparent nausea or vomiting Anesthetic complications: no    Last Vitals:  Vitals:   02/19/18 2013 02/20/18 0427  BP: (!) 140/96 121/76  Pulse: 91 80  Resp: 16 12  Temp: 37.1 C 37.1 C  SpO2: 97% 98%    Last Pain:  Vitals:   02/20/18 0859  TempSrc:   PainSc: 8                  Bosten Newstrom

## 2018-03-03 DIAGNOSIS — N401 Enlarged prostate with lower urinary tract symptoms: Secondary | ICD-10-CM | POA: Diagnosis not present

## 2018-03-03 DIAGNOSIS — R338 Other retention of urine: Secondary | ICD-10-CM | POA: Diagnosis not present

## 2018-03-07 DIAGNOSIS — N401 Enlarged prostate with lower urinary tract symptoms: Secondary | ICD-10-CM | POA: Diagnosis not present

## 2018-03-07 DIAGNOSIS — R338 Other retention of urine: Secondary | ICD-10-CM | POA: Diagnosis not present

## 2018-03-07 DIAGNOSIS — L0291 Cutaneous abscess, unspecified: Secondary | ICD-10-CM | POA: Diagnosis not present

## 2018-03-29 DIAGNOSIS — R7303 Prediabetes: Secondary | ICD-10-CM | POA: Diagnosis not present

## 2018-03-29 DIAGNOSIS — N210 Calculus in bladder: Secondary | ICD-10-CM | POA: Diagnosis not present

## 2018-03-29 DIAGNOSIS — R972 Elevated prostate specific antigen [PSA]: Secondary | ICD-10-CM | POA: Diagnosis not present

## 2018-03-29 DIAGNOSIS — E46 Unspecified protein-calorie malnutrition: Secondary | ICD-10-CM | POA: Diagnosis not present

## 2018-09-13 DIAGNOSIS — N529 Male erectile dysfunction, unspecified: Secondary | ICD-10-CM | POA: Diagnosis not present

## 2018-09-13 DIAGNOSIS — L989 Disorder of the skin and subcutaneous tissue, unspecified: Secondary | ICD-10-CM | POA: Diagnosis not present

## 2018-09-13 DIAGNOSIS — L0291 Cutaneous abscess, unspecified: Secondary | ICD-10-CM | POA: Diagnosis not present

## 2018-10-24 ENCOUNTER — Emergency Department (HOSPITAL_COMMUNITY)
Admission: EM | Admit: 2018-10-24 | Discharge: 2018-10-24 | Disposition: A | Discharge status: Home / Self Care | Payer: PPO | Attending: Emergency Medicine | Admitting: Emergency Medicine

## 2018-10-24 ENCOUNTER — Emergency Department (HOSPITAL_COMMUNITY): Payer: PPO

## 2018-10-24 ENCOUNTER — Encounter (HOSPITAL_COMMUNITY): Payer: Self-pay | Admitting: Emergency Medicine

## 2018-10-24 ENCOUNTER — Other Ambulatory Visit: Payer: Self-pay

## 2018-10-24 DIAGNOSIS — M1611 Unilateral primary osteoarthritis, right hip: Secondary | ICD-10-CM | POA: Diagnosis not present

## 2018-10-24 DIAGNOSIS — M545 Low back pain: Secondary | ICD-10-CM | POA: Diagnosis not present

## 2018-10-24 DIAGNOSIS — M25551 Pain in right hip: Secondary | ICD-10-CM | POA: Diagnosis not present

## 2018-10-24 DIAGNOSIS — M5116 Intervertebral disc disorders with radiculopathy, lumbar region: Secondary | ICD-10-CM | POA: Diagnosis not present

## 2018-10-24 DIAGNOSIS — M1610 Unilateral primary osteoarthritis, unspecified hip: Secondary | ICD-10-CM

## 2018-10-24 MED ORDER — ACETAMINOPHEN ER 650 MG PO TBCR: 650.0000 mg | EXTENDED_RELEASE_TABLET | Freq: Three times a day (TID) | ORAL | 0 refills | Status: AC | PRN

## 2018-10-24 MED ORDER — NAPROXEN 250 MG PO TABS: 250.0000 mg | ORAL_TABLET | Freq: Two times a day (BID) | ORAL | 0 refills | Status: AC

## 2018-10-24 NOTE — ED Notes (Signed)
Pt is alert and oriented x 4 and is verbally responsive. Pt reports 9/10 pain to rt hip and to back. Pt is able to ambulated. Pt is escorted with significant other at this time.
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
## 2018-10-24 NOTE — Discharge Instructions (Signed)
The x-ray shows arthritis of your right hip and also some degenerative spine disease in your lower back. We suspect that your pain is because of your arthritis.  Given that your pain has intensified and affecting her lifestyle we recommend that you follow-up with the orthopedic doctors for further evaluation.  Stop taking ibuprofen and take the medications that have been prescribed for pain control.

## 2018-11-29 DIAGNOSIS — Z1389 Encounter for screening for other disorder: Secondary | ICD-10-CM | POA: Diagnosis not present

## 2018-11-29 DIAGNOSIS — Z13220 Encounter for screening for lipoid disorders: Secondary | ICD-10-CM | POA: Diagnosis not present

## 2018-11-29 DIAGNOSIS — Z1159 Encounter for screening for other viral diseases: Secondary | ICD-10-CM | POA: Diagnosis not present

## 2018-11-29 DIAGNOSIS — Z1211 Encounter for screening for malignant neoplasm of colon: Secondary | ICD-10-CM | POA: Diagnosis not present

## 2018-11-29 DIAGNOSIS — M952 Other acquired deformity of head: Secondary | ICD-10-CM | POA: Diagnosis not present

## 2018-11-29 DIAGNOSIS — E46 Unspecified protein-calorie malnutrition: Secondary | ICD-10-CM | POA: Diagnosis not present

## 2018-11-29 DIAGNOSIS — R972 Elevated prostate specific antigen [PSA]: Secondary | ICD-10-CM | POA: Diagnosis not present

## 2018-11-29 DIAGNOSIS — Z0000 Encounter for general adult medical examination without abnormal findings: Secondary | ICD-10-CM | POA: Diagnosis not present

## 2018-11-29 DIAGNOSIS — R7303 Prediabetes: Secondary | ICD-10-CM | POA: Diagnosis not present

## 2018-11-29 DIAGNOSIS — G4700 Insomnia, unspecified: Secondary | ICD-10-CM | POA: Diagnosis not present

## 2018-11-30 DIAGNOSIS — N401 Enlarged prostate with lower urinary tract symptoms: Secondary | ICD-10-CM | POA: Diagnosis not present

## 2018-11-30 DIAGNOSIS — N210 Calculus in bladder: Secondary | ICD-10-CM | POA: Diagnosis not present

## 2018-11-30 DIAGNOSIS — R351 Nocturia: Secondary | ICD-10-CM | POA: Diagnosis not present

## 2018-12-26 DIAGNOSIS — E46 Unspecified protein-calorie malnutrition: Secondary | ICD-10-CM | POA: Diagnosis not present

## 2018-12-26 DIAGNOSIS — R972 Elevated prostate specific antigen [PSA]: Secondary | ICD-10-CM | POA: Diagnosis not present

## 2018-12-26 DIAGNOSIS — Z13220 Encounter for screening for lipoid disorders: Secondary | ICD-10-CM | POA: Diagnosis not present

## 2018-12-26 DIAGNOSIS — Z1159 Encounter for screening for other viral diseases: Secondary | ICD-10-CM | POA: Diagnosis not present

## 2018-12-26 DIAGNOSIS — R7303 Prediabetes: Secondary | ICD-10-CM | POA: Diagnosis not present

## 2018-12-26 DIAGNOSIS — Z1211 Encounter for screening for malignant neoplasm of colon: Secondary | ICD-10-CM | POA: Diagnosis not present
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
